# Patient Record
Sex: Female | Born: 1950 | Race: White | Hispanic: No | Marital: Single | Smoking: Never smoker
Health system: Southern US, Community
[De-identification: ages and names within clinical notes are randomized; demographics above are authoritative.]

## PROBLEM LIST (undated history)

## (undated) DIAGNOSIS — F329 Major depressive disorder, single episode, unspecified: Secondary | ICD-10-CM

## (undated) DIAGNOSIS — M1990 Unspecified osteoarthritis, unspecified site: Secondary | ICD-10-CM

## (undated) DIAGNOSIS — F32A Depression, unspecified: Secondary | ICD-10-CM

## (undated) DIAGNOSIS — R42 Dizziness and giddiness: Secondary | ICD-10-CM

## (undated) DIAGNOSIS — H905 Unspecified sensorineural hearing loss: Secondary | ICD-10-CM

## (undated) DIAGNOSIS — K219 Gastro-esophageal reflux disease without esophagitis: Secondary | ICD-10-CM

## (undated) DIAGNOSIS — K589 Irritable bowel syndrome without diarrhea: Secondary | ICD-10-CM

## (undated) DIAGNOSIS — G8929 Other chronic pain: Secondary | ICD-10-CM

## (undated) DIAGNOSIS — N189 Chronic kidney disease, unspecified: Secondary | ICD-10-CM

## (undated) DIAGNOSIS — Z972 Presence of dental prosthetic device (complete) (partial): Secondary | ICD-10-CM

## (undated) DIAGNOSIS — F419 Anxiety disorder, unspecified: Secondary | ICD-10-CM

## (undated) HISTORY — PX: CYSTOSCOPY: SUR368

---

## 2002-12-28 ENCOUNTER — Other Ambulatory Visit: Payer: Self-pay

## 2009-12-02 ENCOUNTER — Emergency Department: Payer: Self-pay | Admitting: Emergency Medicine

## 2015-08-20 NOTE — Discharge Instructions (Signed)
General Anesthesia, Adult, Care After °Refer to this sheet in the next few weeks. These instructions provide you with information on caring for yourself after your procedure. Your health care provider may also give you more specific instructions. Your treatment has been planned according to current medical practices, but problems sometimes occur. Call your health care provider if you have any problems or questions after your procedure. °WHAT TO EXPECT AFTER THE PROCEDURE °After the procedure, it is typical to experience: °· Sleepiness. °· Nausea and vomiting. °HOME CARE INSTRUCTIONS °· For the first 24 hours after general anesthesia: °· Have a responsible person with you. °· Do not drive a car. If you are alone, do not take public transportation. °· Do not drink alcohol. °· Do not take medicine that has not been prescribed by your health care provider. °· Do not sign important papers or make important decisions. °· You may resume a normal diet and activities as directed by your health care provider. °· Change bandages (dressings) as directed. °· If you have questions or problems that seem related to general anesthesia, call the hospital and ask for the anesthetist or anesthesiologist on call. °SEEK MEDICAL CARE IF: °· You have nausea and vomiting that continue the day after anesthesia. °· You develop a rash. °SEEK IMMEDIATE MEDICAL CARE IF:  °· You have difficulty breathing. °· You have chest pain. °· You have any allergic problems. °  °This information is not intended to replace advice given to you by your health care provider. Make sure you discuss any questions you have with your health care provider. °  °Document Released: 03/28/2000 Document Revised: 01/10/2014 Document Reviewed: 04/20/2011 °Elsevier Interactive Patient Education ©2016 [employer] ° °Cataract Surgery, Care After °Refer to this sheet in the next few weeks. These instructions provide you with information on caring for yourself after your  procedure. Your caregiver may also give you more specific instructions. Your treatment has been planned according to current medical practices, but problems sometimes occur. Call your caregiver if you have any problems or questions after your procedure.  °HOME CARE INSTRUCTIONS  °· Avoid strenuous activities as directed by your caregiver. °· Ask your caregiver when you can resume driving. °· Use eyedrops or other medicines to help healing and control pressure inside your eye as directed by your caregiver. °· Only take over-the-counter or prescription medicines for pain, discomfort, or fever as directed by your caregiver. °· Do not to touch or rub your eyes. °· You may be instructed to use a protective shield during the first few days and nights after surgery. If not, wear sunglasses to protect your eyes. This is to protect the eye from pressure or from being accidentally bumped. °· Keep the area around your eye clean and dry. Avoid swimming or allowing water to hit you directly in the face while showering. Keep soap and shampoo out of your eyes. °· Do not bend or lift heavy objects. Bending increases pressure in the eye. You can walk, climb stairs, and do light household chores. °· Do not put a contact lens into the eye that had surgery until your caregiver says it is okay to do so. °· Ask your doctor when you can return to work. This will depend on the kind of work that you do. If you work in a dusty environment, you may be advised to wear protective eyewear for a period of time. °· Ask your caregiver when it will be safe to engage in sexual activity. °· Continue with   your regular eye exams as directed by your caregiver. °What to expect: °· It is normal to feel itching and mild discomfort for a few days after cataract surgery. Some fluid discharge is also common, and your eye may be sensitive to light and touch. °· After 1 to 2 days, even moderate discomfort should disappear. In most cases, healing will take about  6 weeks. °· If you received an intraocular lens (IOL), you may notice that colors are very bright or have a blue tinge. Also, if you have been in bright sunlight, everything may appear reddish for a few hours. If you see these color tinges, it is because your lens is clear and no longer cloudy. Within a few months after receiving an IOL, these extra colors should go away. When you have healed, you will probably need new glasses. °SEEK MEDICAL CARE IF:  °· You have increased bruising around your eye. °· You have discomfort not helped by medicine. °SEEK IMMEDIATE MEDICAL CARE IF:  °· You have a  fever. °· You have a worsening or sudden vision loss. °· You have redness, swelling, or increasing pain in the eye. °· You have a thick discharge from the eye that had surgery. °MAKE SURE YOU: °· Understand these instructions. °· Will watch your condition. °· Will get help right away if you are not doing well or get worse. °  °This information is not intended to replace advice given to you by your health care provider. Make sure you discuss any questions you have with your health care provider. °  °Document Released: 07/09/2004 Document Revised: 01/10/2014 Document Reviewed: 08/13/2010 °Elsevier Interactive Patient Education ©2016 Elsevier Inc. ° °

## 2015-08-24 ENCOUNTER — Ambulatory Visit: Payer: BLUE CROSS/BLUE SHIELD | Admitting: Anesthesiology

## 2015-08-24 ENCOUNTER — Encounter: Payer: Self-pay | Admitting: Anesthesiology

## 2015-08-24 ENCOUNTER — Ambulatory Visit
Admission: RE | Admit: 2015-08-24 | Discharge: 2015-08-24 | Disposition: A | Discharge status: Home / Self Care | Payer: BLUE CROSS/BLUE SHIELD | Source: Ambulatory Visit | Attending: Ophthalmology | Admitting: Ophthalmology

## 2015-08-24 ENCOUNTER — Encounter
Admission: RE | Disposition: A | Discharge status: Home / Self Care | Payer: Self-pay | Source: Ambulatory Visit | Attending: Ophthalmology

## 2015-08-24 DIAGNOSIS — Z880 Allergy status to penicillin: Secondary | ICD-10-CM | POA: Insufficient documentation

## 2015-08-24 DIAGNOSIS — M1990 Unspecified osteoarthritis, unspecified site: Secondary | ICD-10-CM | POA: Insufficient documentation

## 2015-08-24 DIAGNOSIS — F329 Major depressive disorder, single episode, unspecified: Secondary | ICD-10-CM | POA: Insufficient documentation

## 2015-08-24 DIAGNOSIS — Z882 Allergy status to sulfonamides status: Secondary | ICD-10-CM | POA: Insufficient documentation

## 2015-08-24 DIAGNOSIS — K219 Gastro-esophageal reflux disease without esophagitis: Secondary | ICD-10-CM | POA: Insufficient documentation

## 2015-08-24 DIAGNOSIS — Z79899 Other long term (current) drug therapy: Secondary | ICD-10-CM | POA: Insufficient documentation

## 2015-08-24 DIAGNOSIS — Z87442 Personal history of urinary calculi: Secondary | ICD-10-CM | POA: Insufficient documentation

## 2015-08-24 DIAGNOSIS — H269 Unspecified cataract: Secondary | ICD-10-CM | POA: Diagnosis present

## 2015-08-24 DIAGNOSIS — K589 Irritable bowel syndrome without diarrhea: Secondary | ICD-10-CM | POA: Insufficient documentation

## 2015-08-24 DIAGNOSIS — H2511 Age-related nuclear cataract, right eye: Secondary | ICD-10-CM | POA: Insufficient documentation

## 2015-08-24 HISTORY — DX: Unspecified osteoarthritis, unspecified site: M19.90

## 2015-08-24 HISTORY — DX: Gastro-esophageal reflux disease without esophagitis: K21.9

## 2015-08-24 HISTORY — DX: Presence of dental prosthetic device (complete) (partial): Z97.2

## 2015-08-24 HISTORY — DX: Chronic kidney disease, unspecified: N18.9

## 2015-08-24 HISTORY — DX: Major depressive disorder, single episode, unspecified: F32.9

## 2015-08-24 HISTORY — PX: CATARACT EXTRACTION W/PHACO: SHX586

## 2015-08-24 HISTORY — DX: Depression, unspecified: F32.A

## 2015-08-24 HISTORY — DX: Irritable bowel syndrome, unspecified: K58.9

## 2015-08-24 SURGERY — PHACOEMULSIFICATION, CATARACT, WITH IOL INSERTION
Anesthesia: Monitor Anesthesia Care | Site: Eye | Laterality: Right | Wound class: Clean

## 2015-08-24 MED ORDER — LACTATED RINGERS IV SOLN: INTRAVENOUS | Status: DC

## 2015-08-24 MED ORDER — TRYPAN BLUE 0.06 % OP SOLN
OPHTHALMIC | Status: DC | PRN
  Administered 2015-08-24: 0.5 mL via INTRAOCULAR

## 2015-08-24 MED ORDER — FENTANYL CITRATE (PF) 100 MCG/2ML IJ SOLN
INTRAMUSCULAR | Status: DC | PRN
  Administered 2015-08-24: 50 ug via INTRAVENOUS

## 2015-08-24 MED ORDER — POVIDONE-IODINE 5 % OP SOLN
1.0000 "application " | OPHTHALMIC | Status: DC | PRN
  Administered 2015-08-24: 1 via OPHTHALMIC

## 2015-08-24 MED ORDER — ACETAMINOPHEN 325 MG PO TABS
650.0000 mg | ORAL_TABLET | Freq: Once | ORAL | Status: AC
  Administered 2015-08-24: 650 mg via ORAL

## 2015-08-24 MED ORDER — MIDAZOLAM HCL 2 MG/2ML IJ SOLN
INTRAMUSCULAR | Status: DC | PRN
  Administered 2015-08-24: 2 mg via INTRAVENOUS

## 2015-08-24 MED ORDER — OXYCODONE HCL 5 MG/5ML PO SOLN: 5.0000 mg | Freq: Once | ORAL | Status: DC | PRN

## 2015-08-24 MED ORDER — OXYCODONE HCL 5 MG PO TABS: 5.0000 mg | ORAL_TABLET | Freq: Once | ORAL | Status: DC | PRN

## 2015-08-24 MED ORDER — NA HYALUR & NA CHOND-NA HYALUR 0.4-0.35 ML IO KIT
PACK | INTRAOCULAR | Status: DC | PRN
  Administered 2015-08-24: 1 mL via INTRAOCULAR

## 2015-08-24 MED ORDER — LIDOCAINE HCL (PF) 4 % IJ SOLN
INTRAMUSCULAR | Status: DC | PRN
  Administered 2015-08-24: 1 mL via OPHTHALMIC

## 2015-08-24 MED ORDER — ARMC OPHTHALMIC DILATING GEL
1.0000 "application " | OPHTHALMIC | Status: DC | PRN
  Administered 2015-08-24 (×2): 1 via OPHTHALMIC

## 2015-08-24 MED ORDER — MOXIFLOXACIN HCL 0.5 % OP SOLN
OPHTHALMIC | Status: DC | PRN
  Administered 2015-08-24: .3 [drp] via OPHTHALMIC

## 2015-08-24 MED ORDER — EPINEPHRINE HCL 1 MG/ML IJ SOLN
INTRAOCULAR | Status: DC | PRN
  Administered 2015-08-24: 128 mL via OPHTHALMIC

## 2015-08-24 MED ORDER — TETRACAINE HCL 0.5 % OP SOLN
1.0000 [drp] | OPHTHALMIC | Status: DC | PRN
  Administered 2015-08-24: 1 [drp] via OPHTHALMIC

## 2015-08-24 MED ORDER — TIMOLOL MALEATE 0.5 % OP SOLN
OPHTHALMIC | Status: DC | PRN
  Administered 2015-08-24: 1 [drp] via OPHTHALMIC

## 2015-08-24 MED ORDER — BRIMONIDINE TARTRATE 0.2 % OP SOLN
OPHTHALMIC | Status: DC | PRN
Start: 2015-08-24 — End: 2015-08-24
  Administered 2015-08-24: 1 [drp] via OPHTHALMIC

## 2015-08-24 SURGICAL SUPPLY — 31 items
APL FBRTP 3 NS LF CTTN WD (MISCELLANEOUS) ×1
APPLICATOR COTTON TIP 3IN (MISCELLANEOUS) ×3 IMPLANT
CANNULA ANT/CHMB 27G (MISCELLANEOUS) ×1 IMPLANT
CANNULA ANT/CHMB 27GA (MISCELLANEOUS) ×3 IMPLANT
DISSECTOR HYDRO NUCLEUS 50X22 (MISCELLANEOUS) ×3 IMPLANT
GLOVE BIO SURGEON STRL SZ7 (GLOVE) ×5 IMPLANT
GLOVE SURG LX 6.5 MICRO (GLOVE) ×2
GLOVE SURG LX STRL 6.5 MICRO (GLOVE) ×1 IMPLANT
GOWN STRL REUS W/ TWL LRG LVL3 (GOWN DISPOSABLE) ×2 IMPLANT
GOWN STRL REUS W/TWL LRG LVL3 (GOWN DISPOSABLE) ×6
LENS IOL ACRYSOF IQ 22.0 (Intraocular Lens) ×2 IMPLANT
MARKER SKIN DUAL TIP RULER LAB (MISCELLANEOUS) ×3 IMPLANT
NDL FILTER BLUNT 18X1 1/2 (NEEDLE) ×1 IMPLANT
NEEDLE FILTER BLUNT 18X 1/2SAF (NEEDLE) ×2
NEEDLE FILTER BLUNT 18X1 1/2 (NEEDLE) ×1 IMPLANT
PACK CATARACT BRASINGTON (MISCELLANEOUS) ×3 IMPLANT
PACK EYE AFTER SURG (MISCELLANEOUS) ×3 IMPLANT
PACK OPTHALMIC (MISCELLANEOUS) ×3 IMPLANT
RING MALYGIN 7.0 (MISCELLANEOUS) IMPLANT
SOL BAL SALT 15ML (MISCELLANEOUS)
SOLUTION BAL SALT 15ML (MISCELLANEOUS) IMPLANT
SUT ETHILON 10-0 CS-B-6CS-B-6 (SUTURE)
SUT VICRYL  9 0 (SUTURE)
SUT VICRYL 9 0 (SUTURE) IMPLANT
SUTURE EHLN 10-0 CS-B-6CS-B-6 (SUTURE) IMPLANT
SYR 3ML LL SCALE MARK (SYRINGE) ×3 IMPLANT
SYR TB 1ML LUER SLIP (SYRINGE) ×3 IMPLANT
WATER STERILE IRR 250ML POUR (IV SOLUTION) ×3 IMPLANT
WATER STERILE IRR 500ML POUR (IV SOLUTION) IMPLANT
WICK EYE OCUCEL (MISCELLANEOUS) IMPLANT
WIPE NON LINTING 3.25X3.25 (MISCELLANEOUS) ×3 IMPLANT

## 2015-08-24 NOTE — Anesthesia Procedure Notes (Signed)
Procedure Name: MAC Performed by: Zaiyden Strozier Pre-anesthesia Checklist: Patient identified, Emergency Drugs available, Suction available, Timeout performed and Patient being monitored Patient Re-evaluated:Patient Re-evaluated prior to inductionOxygen Delivery Method: Nasal cannula Placement Confirmation: positive ETCO2     

## 2015-08-24 NOTE — Transfer of Care (Signed)
Immediate Anesthesia Transfer of Care Note  Patient: Chelsea Skinner  Procedure(s) Performed: Procedure(s) with comments: CATARACT EXTRACTION PHACO AND INTRAOCULAR LENS PLACEMENT (IOC) (Right) - VISION BLUE RIGHT  Patient Location: PACU  Anesthesia Type: MAC  Level of Consciousness: awake, alert  and patient cooperative  Airway and Oxygen Therapy: Patient Spontanous Breathing and Patient connected to supplemental oxygen  Post-op Assessment: Post-op Vital signs reviewed, Patient's Cardiovascular Status Stable, Respiratory Function Stable, Patent Airway and No signs of Nausea or vomiting  Post-op Vital Signs: Reviewed and stable  Complications: No apparent anesthesia complications

## 2015-08-24 NOTE — Anesthesia Postprocedure Evaluation (Signed)
Anesthesia Post Note  Patient: Chelsea Skinner  Procedure(s) Performed: Procedure(s) (LRB): CATARACT EXTRACTION PHACO AND INTRAOCULAR LENS PLACEMENT (IOC) (Right)  Patient location during evaluation: PACU Anesthesia Type: MAC Level of consciousness: awake and alert Pain management: pain level controlled Vital Signs Assessment: post-procedure vital signs reviewed and stable Respiratory status: spontaneous breathing, nonlabored ventilation, respiratory function stable and patient connected to nasal cannula oxygen Cardiovascular status: stable and blood pressure returned to baseline Anesthetic complications: no    Semiyah Newgent

## 2015-08-24 NOTE — H&P (Signed)
H+P reviewed and is up to date, please see paper chart.  

## 2015-08-24 NOTE — Anesthesia Preprocedure Evaluation (Signed)
Anesthesia Evaluation  Patient identified by MRN, date of birth, ID band  Reviewed: NPO status   History of Anesthesia Complications Negative for: history of anesthetic complications  Airway Mallampati: II  TM Distance: >3 FB Neck ROM: full    Dental  (+) Upper Dentures, Lower Dentures   Pulmonary neg pulmonary ROS,    Pulmonary exam normal        Cardiovascular Exercise Tolerance: Good negative cardio ROS Normal cardiovascular exam     Neuro/Psych Depression negative neurological ROS     GI/Hepatic Neg liver ROS, GERD  ,ibs    Endo/Other  negative endocrine ROS  Renal/GU negative Renal ROS  negative genitourinary   Musculoskeletal  (+) Arthritis ,   Abdominal   Peds  Hematology negative hematology ROS (+)   Anesthesia Other Findings   Reproductive/Obstetrics                             Anesthesia Physical Anesthesia Plan  ASA: II  Anesthesia Plan: MAC   Post-op Pain Management:    Induction:   Airway Management Planned:   Additional Equipment:   Intra-op Plan:   Post-operative Plan:   Informed Consent: I have reviewed the patients History and Physical, chart, labs and discussed the procedure including the risks, benefits and alternatives for the proposed anesthesia with the patient or authorized representative who has indicated his/her understanding and acceptance.     Plan Discussed with: CRNA  Anesthesia Plan Comments:         Anesthesia Quick Evaluation

## 2015-08-24 NOTE — Op Note (Addendum)
Date of Surgery: 08/24/2015  PREOPERATIVE DIAGNOSES: Visually significant dense nuclear sclerotic cataract, right eye.  POSTOPERATIVE DIAGNOSES: Same  PROCEDURES PERFORMED: Cataract extraction with intraocular lens implant, right eye with aid of vision blue dye.  SURGEON: Devin GoingAnita P. Vin, M.D.  ANESTHESIA: MAC and topical  IMPLANTS: [id number] +22.0 D  Implant Name Type Inv. Item Serial No. Manufacturer Lot No. LRB No. Used  LENS IOL ACRYSOF IQ 22.0 - [id number] Intraocular Lens LENS IOL ACRYSOF IQ 22.0 [id number] ALCON   Right 1     COMPLICATIONS: None.  DESCRIPTION OF PROCEDURE: Therapeutic options were discussed with the patient preoperatively, including a discussion of risks and benefits of surgery. Informed consent was obtained. An IOL-Master and immersion biometry were used to take the lens measurements, and a dilated fundus exam was performed within 6 months of the surgical date.  The patient was premedicated and brought to the operating room and placed on the operating table in the supine position. After adequate anesthesia, the patient was prepped and draped in the usual sterile ophthalmic fashion. A wire lid speculum was inserted and the microscope was positioned. A Superblade was used to create a paracentesis site at the limbus and a small amount of dilute preservative free lidocaine was instilled into the anterior chamber, followed by vision blue, then dispersive viscoelastic. A clear corneal incision was created temporally using a 2.4 mm keratome blade. Capsulorrhexis was then performed. In situ phacoemulsification was performed.  Cortical material was removed with the irrigation-aspiration unit. Dispersive viscoelastic was instilled to open the capsular bag. A posterior chamber intraocular lens with the specifications above was inserted and positioned. Irrigation-aspiration was used to remove all viscoelastic. Vigamox 1cc was instilled into the anterior chamber, and the corneal  incision was checked and found to be water tight. The eyelid speculum was removed.  The operative eye was covered with protective goggles after instilling 1 drop of timolol and brimonidine. The patient tolerated the procedure well. There were no complications.

## 2015-08-25 ENCOUNTER — Encounter: Payer: Self-pay | Admitting: Ophthalmology

## 2016-02-05 ENCOUNTER — Emergency Department: Payer: Worker's Compensation

## 2016-02-05 ENCOUNTER — Emergency Department
Admission: EM | Admit: 2016-02-05 | Discharge: 2016-02-05 | Disposition: A | Discharge status: Home / Self Care | Payer: Worker's Compensation | Attending: Emergency Medicine | Admitting: Emergency Medicine

## 2016-02-05 ENCOUNTER — Encounter: Payer: Self-pay | Admitting: Emergency Medicine

## 2016-02-05 DIAGNOSIS — S0990XA Unspecified injury of head, initial encounter: Secondary | ICD-10-CM | POA: Insufficient documentation

## 2016-02-05 DIAGNOSIS — W109XXA Fall (on) (from) unspecified stairs and steps, initial encounter: Secondary | ICD-10-CM | POA: Insufficient documentation

## 2016-02-05 DIAGNOSIS — S40012A Contusion of left shoulder, initial encounter: Secondary | ICD-10-CM | POA: Diagnosis not present

## 2016-02-05 DIAGNOSIS — Z23 Encounter for immunization: Secondary | ICD-10-CM | POA: Insufficient documentation

## 2016-02-05 DIAGNOSIS — Y929 Unspecified place or not applicable: Secondary | ICD-10-CM | POA: Insufficient documentation

## 2016-02-05 DIAGNOSIS — S0003XA Contusion of scalp, initial encounter: Secondary | ICD-10-CM

## 2016-02-05 DIAGNOSIS — S29012A Strain of muscle and tendon of back wall of thorax, initial encounter: Secondary | ICD-10-CM | POA: Insufficient documentation

## 2016-02-05 DIAGNOSIS — S8981XA Other specified injuries of right lower leg, initial encounter: Secondary | ICD-10-CM

## 2016-02-05 DIAGNOSIS — Y9389 Activity, other specified: Secondary | ICD-10-CM | POA: Insufficient documentation

## 2016-02-05 DIAGNOSIS — Y990 Civilian activity done for income or pay: Secondary | ICD-10-CM | POA: Diagnosis not present

## 2016-02-05 DIAGNOSIS — S29019A Strain of muscle and tendon of unspecified wall of thorax, initial encounter: Secondary | ICD-10-CM

## 2016-02-05 DIAGNOSIS — N189 Chronic kidney disease, unspecified: Secondary | ICD-10-CM | POA: Insufficient documentation

## 2016-02-05 DIAGNOSIS — S8011XA Contusion of right lower leg, initial encounter: Secondary | ICD-10-CM | POA: Diagnosis not present

## 2016-02-05 DIAGNOSIS — S161XXA Strain of muscle, fascia and tendon at neck level, initial encounter: Secondary | ICD-10-CM | POA: Diagnosis not present

## 2016-02-05 DIAGNOSIS — Z79899 Other long term (current) drug therapy: Secondary | ICD-10-CM | POA: Insufficient documentation

## 2016-02-05 DIAGNOSIS — S80811A Abrasion, right lower leg, initial encounter: Secondary | ICD-10-CM

## 2016-02-05 DIAGNOSIS — S199XXA Unspecified injury of neck, initial encounter: Secondary | ICD-10-CM | POA: Diagnosis present

## 2016-02-05 LAB — URINALYSIS, COMPLETE (UACMP) WITH MICROSCOPIC
BILIRUBIN URINE: NEGATIVE
Bacteria, UA: NONE SEEN
GLUCOSE, UA: NEGATIVE mg/dL
Hgb urine dipstick: NEGATIVE
KETONES UR: NEGATIVE mg/dL
LEUKOCYTES UA: NEGATIVE
NITRITE: NEGATIVE
PH: 5 (ref 5.0–8.0)
Protein, ur: NEGATIVE mg/dL
SPECIFIC GRAVITY, URINE: 1.016 (ref 1.005–1.030)

## 2016-02-05 MED ORDER — TETANUS-DIPHTH-ACELL PERTUSSIS 5-2.5-18.5 LF-MCG/0.5 IM SUSP
0.5000 mL | Freq: Once | INTRAMUSCULAR | Status: AC
  Administered 2016-02-05: 0.5 mL via INTRAMUSCULAR
  Filled 2016-02-05: qty 0.5

## 2016-02-05 MED ORDER — HYDROCODONE-ACETAMINOPHEN 5-325 MG PO TABS: 1.0000 | ORAL_TABLET | ORAL | 0 refills | Status: AC | PRN

## 2016-02-05 NOTE — ED Triage Notes (Signed)
Pt states she tripped and fell this AM at work. Pt states she has laceration to right lower leg that has dressing present during triage. Pt also complains of pain to left leg. Pt states she did hit her head on a pole but denies LOC, headaches.

## 2016-02-05 NOTE — ED Provider Notes (Signed)
[facility] Emergency Department Provider Note  ____________________________________________   First MD Initiated Contact with Patient 02/05/16 1007     (approximate)  I have reviewed the triage vital signs and the nursing notes.   HISTORY  Chief Complaint Fall    HPI Chelsea Skinner is a 66 y.o. female is here with complaint of multiple body aches. Patient fell this morning at work while going down steps. She believes that she hit her shoe against some uneven concrete at the top of the stairs causing her to tumble 12 steps. Coworkers state that she struck her head twice before stopping. Patient denies any loss of consciousness. She denies any nausea or vomiting. She does complain of her left shoulder hurting and has she rates her pain an abrasion to her right lower leg. Bleeding is under control. Patient is unsure of when the last time she had a tetanus booster. Patient has been able to ambulate with minimal assistance. She also has had some upper back and neck pain since falling.She rates her pain is 7 out of 10 at present. Nothing has improved or made her pain worse.   Past Medical History:  Diagnosis Date  . Arthritis    BOTH KNEES  . Chronic kidney disease    HX/O  . Depression   . GERD (gastroesophageal reflux disease)   . IBS (irritable bowel syndrome)   . Wears partial dentures    UPPER AND LOWER    There are no active problems to display for this patient.   Past Surgical History:  Procedure Laterality Date  . CATARACT EXTRACTION W/PHACO Right 08/24/2015   Procedure: CATARACT EXTRACTION PHACO AND INTRAOCULAR LENS PLACEMENT (IOC);  Surgeon: Sherald Hess, MD;  [facility];  Service: Ophthalmology;  Laterality: Right;  VISION BLUE RIGHT  . CYSTOSCOPY      Prior to Admission medications   Medication Sig Start Date End Date Taking? Authorizing Provider  dicyclomine (BENTYL) 10 MG capsule Take 10 mg by mouth at  bedtime.     Historical Provider, MD  HYDROcodone-acetaminophen (NORCO/VICODIN) 5-325 MG tablet Take 1 tablet by mouth every 4 (four) hours as needed for moderate pain. 02/05/16   Tommi Rumps, PA-C  Multiple Vitamin (MULTIVITAMIN) tablet Take 1 tablet by mouth daily. AM    Historical Provider, MD  Omega-3 Fatty Acids (FISH OIL) 1000 MG CAPS Take 1 capsule by mouth. AM    Historical Provider, MD  venlafaxine XR (EFFEXOR-XR) 150 MG 24 hr capsule Take 150 mg by mouth at bedtime.    Historical Provider, MD    Allergies Penicillins and Sulfa antibiotics  No family history on file.  Social History Social History  Substance Use Topics  . Smoking status: Never Smoker  . Smokeless tobacco: Never Used  . Alcohol use No    Review of Systems Constitutional: No fever/chills Eyes: No visual changes. ENT: No trauma Cardiovascular: Denies chest pain. Respiratory: Denies shortness of breath. Gastrointestinal: No abdominal pain.  No nausea, no vomiting.   Genitourinary: Negative for dysuria. Musculoskeletal: Positive cervical pain, positive left shoulder pain, positive right lower leg pain. Skin: Positive for abrasions and ecchymosis. Neurological: Negative for headaches, focal weakness or numbness.  10-point ROS otherwise negative.  ____________________________________________   PHYSICAL EXAM:  VITAL SIGNS: ED Triage Vitals [02/05/16 0910]  Enc Vitals Group     BP 140/69     Pulse Rate 85     Resp 18     Temp 98.3 F (36.8  C)     Temp Source Oral     SpO2 99 %     Weight 165 lb (74.8 kg)     Height 5\' 8"  (1.727 m)     Head Circumference      Peak Flow      Pain Score 7     Pain Loc      Pain Edu?      Excl. in GC?     Constitutional: Alert and oriented. Well appearing and in no acute distress. Eyes: Conjunctivae are normal. PERRL. EOMI. Head: Atraumatic. Nose: No trauma Mouth:  No trauma exteriorly. Neck: No stridor.  Minimal cervical spine tenderness. No gross  deformity. No ecchymosis or abrasions were noted. Range of motion is without restriction. Cardiovascular: Normal rate, regular rhythm. Grossly normal heart sounds.  Good peripheral circulation. Respiratory: Normal respiratory effort.  No retractions. Lungs CTAB. Gastrointestinal: Soft and nontender. No distention.  Musculoskeletal: On examination of the left shoulder there is an ecchymotic area on the anterior aspect of the proximal humeral area. Range of motion is slightly restricted secondary to discomfort. No gross deformity is noted. There is tenderness on palpation of the upper thoracic spine without gross deformity noted. Range of motion is minimally restricted. There is no tenderness on palpation of the lumbar spine or pelvic region. There is tenderness on palpation of the anterior right tibia with a soft tissue abrasion with no active bleeding at this time. No gross deformity was noted. Patient is able to move right knee and ankle with minimal discomfort. Left lower extremity without any obvious trauma. Bilateral ankles are nontender. Neurologic:  Normal speech and language. No gross focal neurologic deficits are appreciated. Gait was not tested due to patient injuries at this time. Posterior scalp with tender areas but no abrasions or bleeding was noted. Skin:  Skin is warm, dry and intact. Ecchymosis left anterior shoulder, abrasion anterior right tib-fib midline Psychiatric: Mood and affect are normal. Speech and behavior are normal.  ____________________________________________   LABS (all labs ordered are listed, but only abnormal results are displayed)  Labs Reviewed  URINALYSIS, COMPLETE (UACMP) WITH MICROSCOPIC - Abnormal; Notable for the following:       Result Value   Color, Urine YELLOW (*)    APPearance CLEAR (*)    Squamous Epithelial / LPF 0-5 (*)    All other components within normal limits    RADIOLOGY Right shoulder x-ray per radiologist is negative for fracture  dislocation. Right tib-fib per radiologist is negative for acute bony abnormality. Thoracic spine per radiologist is negative for acute findings. I, Tommi Rumps, personally viewed and evaluated these images (plain radiographs) as part of my medical decision making, as well as reviewing the written report by the radiologist.  CT head and cervical spine without contrast radiologist: IMPRESSION: 1. Normal for age non contrast CT appearance of the brain. 2. No acute fracture or listhesis identified in the cervical spine. 3. 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 ____________________________________________   PROCEDURES  Procedure(s) performed: None  Procedures  Critical Care performed: No  ____________________________________________   INITIAL IMPRESSION / ASSESSMENT AND PLAN / ED COURSE  Pertinent labs & imaging results that were available during my care of the patient were reviewed by me and considered in my medical decision making (see chart for details).    Clinical Course as of Feb 05 1611  Fri Feb 05, 2016  1138 DG Shoulder Left [RS]    Clinical Course  User Index [RS] Tommi Rumpshonda L Maryl Blalock, PA-C   Patient was made aware that her x-rays did not show any acute injury. She was also reassured that her head and neck did not show any acute injury or injury to the brain. Patient is aware that she does have degenerative changes and is also aware that she will be more sore tomorrow than she is currently. Patient was instructed to use mild soap and water to her right leg and watch for signs of infection around her abrasion. She is encouraged to use ice and elevation to reduce swelling and pain. Patient was given a prescription for Norco and ibuprofen as needed for pain and inflammation. She is [facility] if any continued problems. Patient is aware that she is not to take any medication that could cause  drowsiness and increase her risk for injury. Patient was also given a tetanus booster prior to her discharge.  ____________________________________________   FINAL CLINICAL IMPRESSION(S) / ED DIAGNOSES  Final diagnoses:  Cervical strain, acute, initial encounter  Strain of thoracic region, initial encounter  Contusion of left shoulder, initial encounter  Contusion of right tibia  Abrasion of anterior right lower leg, initial encounter  Contusion of scalp, initial encounter      NEW MEDICATIONS STARTED DURING THIS VISIT:  Discharge Medication List as of 02/05/2016 11:57 AM    START taking these medications   Details  HYDROcodone-acetaminophen (NORCO/VICODIN) 5-325 MG tablet Take 1 tablet by mouth every 4 (four) hours as needed for moderate pain., Starting Fri 02/05/2016, Print         Note:  This document was prepared using Dragon voice recognition software and may include unintentional dictation errors.    Tommi RumpsRhonda L Jinx Gilden, PA-C 02/05/16 1613    Jennye MoccasinBrian S Quigley, MD 02/06/16 [phone]

## 2016-02-05 NOTE — ED Notes (Signed)
Pt still in radiology.

## 2016-02-05 NOTE — Discharge Instructions (Signed)
Begin taking Norco and ibuprofen as needed for pain. Ice to your shoulder and leg as needed for pain and swelling. You may also elevate your right leg if needed to help control swelling and pain. Follow-up with her primary care doctor or [facility] if any continued problems. Watch abrasion for any signs of infection. Clean daily with mild soap and water. Do not take pain medication while driving or operating machinery as this may cause increased drowsiness and increase your risk for injury.

## 2016-11-29 ENCOUNTER — Other Ambulatory Visit: Payer: Self-pay

## 2016-11-29 ENCOUNTER — Ambulatory Visit: Payer: Worker's Compensation | Attending: Neurosurgery | Admitting: *Deleted

## 2016-11-29 DIAGNOSIS — R41841 Cognitive communication deficit: Secondary | ICD-10-CM | POA: Diagnosis present

## 2016-11-29 NOTE — Therapy (Signed)
[facility] [address] [place], [place], [address] Phone: [phone]   Fax:  [phone]  Speech Language Pathology Evaluation  Patient Details  Name: Chelsea Skinner MRN: [id number] Date of Birth: 1950-12-03 Referring Provider: Dr. Amalia Hailey   Encounter Date: 11/29/2016  End of Session - 11/29/16 1504    Visit Number  1    Number of Visits  13    Date for SLP Re-Evaluation  01/17/17    Authorization Type  evaluation plus 12 visits    Authorization Time Period  expires 01/17/17    Authorization - Visit Number  1    Authorization - Number of Visits  13    SLP Start Time  1400    SLP Stop Time   1450    SLP Time Calculation (min)  50 min    Activity Tolerance  Patient tolerated treatment well       Past Medical History:  Diagnosis Date  . Arthritis    BOTH KNEES  . Chronic kidney disease    HX/O  . Depression   . GERD (gastroesophageal reflux disease)   . IBS (irritable bowel syndrome)   . Wears partial dentures    UPPER AND LOWER    Past Surgical History:  Procedure Laterality Date  . CATARACT EXTRACTION W/PHACO Right 08/24/2015   Procedure: CATARACT EXTRACTION PHACO AND INTRAOCULAR LENS PLACEMENT (IOC);  Surgeon: Sherald Hess, MD;  Location: [facility];  Service: Ophthalmology;  Laterality: Right;  VISION BLUE RIGHT  . CYSTOSCOPY      There were no vitals filed for this visit.  Subjective Assessment - 11/29/16 1414    Subjective  They said it would take some time to get my head together again. I have trouble remembering sometimes, everythihng is confusing to me    Currently in Pain?  Yes    Pain Score  6     Pain Location  Head    Pain Orientation  Right;Mid    Pain Descriptors / Indicators  Aching    Pain Type  Acute pain    Pain Onset  More than a month ago    Pain Frequency  Constant    Aggravating Factors   stress    Pain Relieving Factors  tylenol     Effect of  Pain on Daily Activities  occasionally have to go to bed         SLP Evaluation [facility] - 11/29/16 1414      SLP Visit Information   SLP Received On  11/29/16    Referring Provider  Dr. Delorise Royals Bhowmick    Onset Date  11/02/16    Medical Diagnosis  fall down stairs      Subjective   Subjective  Pt seen in outpatient clinic for speech therapy evaluation following TBI/fall    Patient/Family Stated Goal  to return to remorbid level of independence and activity      General Information   HPI  66 year old female referred for outpatient speech therapy evaluation following a fall down a flight of stairs on 11/02/16.   Pt sustained a left temporal intraparenchymal hemorrhage, left SDH with 3mm shift, SAH along left vertex and right parietal lobe, minimally displaced fracture of the right lateral orbital wall, fracture of right greater wing of the sphenoid bone, and nondisplaced fractures of the right zygomatic arch and right nasal bone.   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      Cognition   Overall Cognitive Status  Impaired/Different from baseline    Area of Impairment  Memory;Problem solving    Memory  Decreased short-term memory    Memory Comments  Pt reports difficulty recalling medications    Problem Solving  Difficulty sequencing    Problem Solving Comments  -- thought organization and reasoning deficits    Attention  Alternating;Divided    Alternating Attention   Impaired    Alternating Attention Impairment  Verbal basic;Functional basic    Divided Attention  Impaired    Divided Attention Impairment  Verbal basic;Functional basic    Memory  Impaired    Memory Impairment  Decreased recall of new information;Decreased short term memory;Prospective memory    Decreased Short Term Memory  Verbal complex;Functional complex    Awareness  Appears intact    Problem Solving  Impaired    Problem Solving Impairment  Verbal basic;Verbal complex;Functional basic;Functional complex    Executive Function  Reasoning;Organizing;Self Monitoring;Self Correcting difficulty on MoCA subtest    Reasoning  Impaired    Reasoning Impairment  Verbal complex;Functional complex    Organizing  Impaired    Organizing Impairment  Verbal basic;Functional basic    Self Monitoring  Impaired    Self Monitoring Impairment  Verbal basic;Functional basic    Self Correcting  Impaired    Self Correcting Impairment  Verbal basic;Functional basic    Behaviors  Poor frustration tolerance, occasionally tearful     Auditory Comprehension   Overall Auditory Comprehension  Appears within functional limits for tasks assessed      Visual Recognition/Discrimination   Discrimination  Within Function Limits      Reading Comprehension   Reading Status  Within funtional limits      Expression   Primary Mode of Expression  Verbal      Verbal Expression   Overall Verbal Expression  Appears within functional limits for tasks assessed      Written Expression   Dominant Hand  Right    Written Expression  Within Functional Limits      Oral Motor/Sensory Function   Overall Oral Motor/Sensory Function  Appears within functional limits for tasks assessed      Motor Speech   Overall Motor Speech  Appears within functional limits for tasks assessed      Standardized Assessments   Standardized Assessments   Montreal Cognitive Assessment (MOCA)    Montreal Cognitive Assessment (MOCA)   27/30                       SLP Education - 11/29/16 1503    Education provided  Yes    Education Details  MoCA results, recommendation for outpatient ST for higher level cognitive linguistic deficits and executive functions    Person(s) Educated  Patient    Methods  Explanation;Verbal cues    Comprehension  Verbalized understanding;Need further instruction  SLP Short Term Goals - 11/29/16 1511      SLP SHORT TERM GOAL #1   Title  Pt will complete simple thought organization/problem solving/reasoning tasks with 80% accuracy given mod cues    Time  4    Period  Weeks    Status  New      SLP SHORT TERM GOAL #2   Title  Pt will identify and develop compensatory strategy for functional recall with mod assist    Time  4    Period  Weeks    Status  New      SLP SHORT TERM GOAL #3   Title  Pt will fill med organizer accurately, given min cues    Time  4    Period  Weeks    Status  New       SLP Long Term Goals - 11/29/16 1513      SLP LONG TERM GOAL #1   Title  Pt will complete level 3/abstract thought organization, problem solving/reasoning tasks with 90% accuracy given min cues    Time  8    Period  Weeks    Status  New      SLP LONG TERM GOAL #2   Title  Pt will demonstrate use of compensatory strategy for functional recall at least 2x outside of therapy session    Time  8    Period  Weeks    Status  New      SLP LONG TERM GOAL #3   Title  Pt will accurately fill med organizer independently.    Time  8    Period  Weeks    Status  New       Plan - 11/29/16 1505    Clinical Impression Statement  The Montreal Cognitive Assessment (MoCA) was administered. Pt scored 27/30 (n=26+/30) indicating performance within functional limits for pt age and level of education. Points were lost on executive function, thought organization, and abstract reasoning subtests. Additionally, the multiprocess reasoning tasks from the Scales of Cognitive of Traumatic Brain Injury  (SCATBI) were administered. Pt was able to accurately complete 1/3 activities, indicating difficulty with higher level cognitive processing, reasoning, problem solving and attention.   These deficits identified today correlate to pt report of difficulty recalling medications, and her difficulty returning to her previous level of function in the home, where she was managing the household finances as well as cooking and cleaning. 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   Skilled ST intervention is recommended to maximize pt cognitive linguistic function for return to prior level of function and improved quality of life.     Speech Therapy Frequency  2x / week    Duration  -- 8 weeks or 12 more visits    Treatment/Interventions  Internal/external aids;Functional tasks;SLP instruction and feedback;Patient/family education;Compensatory techniques;Cognitive reorganization    Potential to Achieve Goals  Good    Potential Considerations  Ability to learn/carryover information;Cooperation/participation level;Previous level of function;Family/community support;Severity of impairments    SLP Home Exercise Plan  reviewed with pt    Consulted and Agree with Plan of Care  Patient       Patient will benefit from skilled therapeutic intervention in order to improve the following deficits and impairments:   Cognitive communication deficit  G-Codes - 11/29/16 1516    Functional Assessment Tool Used  asha noms, clinical judgment. MoCA    Functional Limitations  Attention  Attention Current Status [phone](G9165)  At least 1 percent but less than 20 percent impaired, limited or restricted    Attention Goal Status ([id number](G9166)  At least 1 percent but less than 20 percent impaired, limited or restricted       Problem List There are no active problems to display for this patient.  Ivone Licht B. Murvin NatalBueche, [facility] HospitalMSP, CCC-SLP Speech Language Pathologist  Leigh AuroraBueche, Daivion Pape  Brown 11/29/2016, 3:18 PM  [facility] [address] [place], [place], [address] Phone: [phone]   Fax:  [phone]  Name: [name] Alldredge MRN: [id number] Date of Birth: 08/02/1950

## 2016-12-09 ENCOUNTER — Ambulatory Visit: Payer: Worker's Compensation | Attending: Neurosurgery

## 2016-12-09 ENCOUNTER — Other Ambulatory Visit: Payer: Self-pay

## 2016-12-09 DIAGNOSIS — R41841 Cognitive communication deficit: Secondary | ICD-10-CM | POA: Insufficient documentation

## 2016-12-09 NOTE — Patient Instructions (Signed)
  Please complete the assigned speech therapy homework prior to your next session and return it to the speech therapist at your next visit. If you need help with homework, please ask someone to help you.

## 2016-12-09 NOTE — Therapy (Signed)
[facility] [address] [place], [place], [address] Phone: [phone]   Fax:  [phone]  Speech Language Pathology Treatment  Patient Details  Name: Chelsea Skinner MRN: [id number] Date of Birth: 1950/02/08 Referring Provider: Dr. Amalia Haileyeb A Bhowmick   Encounter Date: 12/09/2016  End of Session - 12/09/16 1545    Visit Number  2    Number of Visits  13    Date for SLP Re-Evaluation  01/17/17    Authorization Type  evaluation plus 12 visits    Authorization Time Period  expires 01/17/17    Authorization - Visit Number  2    Authorization - Number of Visits  13    SLP Start Time  1452    SLP Stop Time   1532    SLP Time Calculation (min)  40 min       Past Medical History:  Diagnosis Date  . Arthritis    BOTH KNEES  . Chronic kidney disease    HX/O  . Depression   . GERD (gastroesophageal reflux disease)   . IBS (irritable bowel syndrome)   . Wears partial dentures    UPPER AND LOWER    Past Surgical History:  Procedure Laterality Date  . CATARACT EXTRACTION W/PHACO Right 08/24/2015   Procedure: CATARACT EXTRACTION PHACO AND INTRAOCULAR LENS PLACEMENT (IOC);  Surgeon: Sherald HessAnita Prakash Vin-Parikh, MD;  [facility];  Service: Ophthalmology;  Laterality: Right;  VISION BLUE RIGHT  . CYSTOSCOPY      There were no vitals filed for this visit.  Subjective Assessment - 12/09/16 1455    Subjective  Pt reports filling her med box. SLP suggested pt have someone to check her work for at least 3 refill cycles.    Pain Score  0-No pain            ADULT SLP TREATMENT - 12/09/16 1457      General Information   Behavior/Cognition  Alert;Cooperative;Pleasant mood      Cognitive-Linquistic Treatment   Treatment focused on  Cognition    Skilled Treatment  Pt reported filling her own med box but no one going behind her right now for verifcation of corect filling of box. With a simple deductive  reasoning puzzle, pt demo'd impulsiveness which exacerbated her decr'd error awareness and decr'd reasoning skills. SLP abandoned task and moved to next task- In the second almost identical task pt req'd max A usually. SLP returned to initial task reviewing with pt what she had learned in the task just prior and pt still req'd max A usually. Pt with error awareness with these tasks <10% of the time. Simple scrambled sentence task: pt req'd SBA for word total up to 7 words and no word repetitions. With word repetitions (e.g., "vetoed the the House the...") and/or sentences >7 words pt req'd occasional mod A. SLP asked pt if she should have someone go behind her to double check her medication box and she agreed that would be best. Homework provided.      Assessment / Recommendations / Plan   Plan  Continue with current plan of care      Progression Toward Goals   Progression toward goals  Progressing toward goals       SLP Education - 12/09/16 1544    Education provided  Yes    Education Details  defict areas, need someone to double check med box filling for at least three intervals.    Person(s) Educated  Patient  Methods  Explanation    Comprehension  Verbalized understanding;Need further instruction       SLP Short Term Goals - 12/09/16 1549      SLP SHORT TERM GOAL #1   Title  Pt will complete simple thought organization/problem solving/reasoning tasks with 80% accuracy given mod cues    Time  4    Period  Weeks    Status  On-going      SLP SHORT TERM GOAL #2   Title  Pt will identify and develop compensatory strategy for functional recall with mod assist    Time  4    Period  Weeks    Status  On-going      SLP SHORT TERM GOAL #3   Title  Pt will fill med organizer accurately, given min cues    Time  4    Period  Weeks    Status  On-going       SLP Long Term Goals - 12/09/16 1549      SLP LONG TERM GOAL #1   Title  Pt will complete level 3/abstract thought  organization, problem solving/reasoning tasks with 90% accuracy given min cues    Time  8    Period  Weeks    Status  On-going      SLP LONG TERM GOAL #2   Title  Pt will demonstrate use of compensatory strategy for functional recall at least 2x outside of therapy session    Time  8    Period  Weeks    Status  On-going      SLP LONG TERM GOAL #3   Title  Pt will accurately fill med organizer independently.    Time  8    Period  Weeks    Status  On-going       Plan - 12/09/16 1546    Clinical Impression Statement  Pt presented today with mod cognitive linguistic deficts in intellectual and emergent awareness, reasoning/problem solving, organization, and executive function. Pt reported filling her own med box and no one going behind her to double check. SLP strongly suggested pt have family member to double check med box and pt agreed with this at end of session. Pt would cont to beneift from skilled ST to maximize cognitive linguistic function for as close to return to PLOF as possible.     Speech Therapy Frequency  2x / week    Duration  -- 8 weeks or 12 more visits    Treatment/Interventions  Internal/external aids;Functional tasks;SLP instruction and feedback;Patient/family education;Compensatory techniques;Cognitive reorganization    Potential to Achieve Goals  Good    SLP Home Exercise Plan  reviewed with pt    Consulted and Agree with Plan of Care  Patient       Patient will benefit from skilled therapeutic intervention in order to improve the following deficits and impairments:   Cognitive communication deficit    Problem List There are no active problems to display for this patient.   [facility] PhfCHINKE,Chalmer Zheng ,MS, CCC-SLP  12/09/2016, 3:50 PM  [facility] [address] [place], [place], [address] Phone: [phone]   Fax:  [phone]   Name: Chelsea Skinner MRN: [id number] Date of Birth: 1950/06/19

## 2016-12-16 ENCOUNTER — Other Ambulatory Visit: Payer: Self-pay

## 2016-12-16 ENCOUNTER — Ambulatory Visit: Payer: Worker's Compensation | Attending: Neurosurgery

## 2016-12-16 DIAGNOSIS — R41841 Cognitive communication deficit: Secondary | ICD-10-CM | POA: Diagnosis not present

## 2016-12-16 NOTE — Therapy (Signed)
[facility] [address] [place], [place], [address] Phone: [phone]   Fax:  [phone]  Speech Language Pathology Treatment  Patient Details  Name: Chelsea KubaWanda Skinner MRN: [id number] Date of Birth: 23-Jan-1950 Referring Provider: Dr. Amalia Haileyeb A Bhowmick   Encounter Date: 12/16/2016  End of Session - 12/16/16 1416    Visit Number  3    Number of Visits  13    Date for SLP Re-Evaluation  01/17/17    Authorization Type  evaluation plus 12 visits    Authorization Time Period  expires 01/17/17    Authorization - Visit Number  3    Authorization - Number of Visits  13    SLP Start Time  1317    SLP Stop Time   1400    SLP Time Calculation (min)  43 min    Activity Tolerance  Patient tolerated treatment well       Past Medical History:  Diagnosis Date  . Arthritis    BOTH KNEES  . Chronic kidney disease    HX/O  . Depression   . GERD (gastroesophageal reflux disease)   . IBS (irritable bowel syndrome)   . Wears partial dentures    UPPER AND LOWER    Past Surgical History:  Procedure Laterality Date  . CATARACT EXTRACTION W/PHACO Right 08/24/2015   Procedure: CATARACT EXTRACTION PHACO AND INTRAOCULAR LENS PLACEMENT (IOC);  Surgeon: Sherald HessAnita Prakash Vin-Parikh, MD;  [facility];  Service: Ophthalmology;  Laterality: Right;  VISION BLUE RIGHT  . CYSTOSCOPY      There were no vitals filed for this visit.  Subjective Assessment - 12/16/16 1319    Subjective  Pt got out homework with SLP asking for it.    Currently in Pain?  Yes    Pain Score  6     Pain Location  Shoulder    Pain Orientation  Right;Left    Pain Descriptors / Indicators  Sore    Pain Type  Acute pain    Pain Onset  More than a month ago    Pain Frequency  Constant    Aggravating Factors   nothing    Pain Relieving Factors  meds            ADULT SLP TREATMENT - 12/16/16 1323      General Information   Behavior/Cognition   Alert;Cooperative;Pleasant mood      Treatment Provided   Treatment provided  Cognitive-Linquistic      Cognitive-Linquistic Treatment   Treatment focused on  Cognition    Skilled Treatment  Pt had her husband look over her med box and he reportedly found everything correct. SLP engaged pt in simple reasoning and attention to detail task (grocery comparison) and req'd mod A for problem solving (how to reduce $ spent to get under budget) and min A consistently for error awareness. SLP suggested pt have husband look over any banking she may do, pt agreed. Pt became tearful when discussing return to work - pt does not feel she's ready to return in next 4 weeks and SLP agreed with pt.       Assessment / Recommendations / Plan   Plan  Continue with current plan of care      Progression Toward Goals   Progression toward goals  Progressing toward goals       SLP Education - 12/16/16 1415    Education provided  Yes    Education Details  important to have  husband check any banking pt performs, not ready to go back to work in next 4 weeks    Person(s) Educated  Patient    Methods  Explanation    Comprehension  Verbalized understanding       SLP Short Term Goals - 12/16/16 1419      SLP SHORT TERM GOAL #1   Title  Pt will complete simple thought organization/problem solving/reasoning tasks with 80% accuracy given mod cues    Time  3    Period  Weeks    Status  On-going      SLP SHORT TERM GOAL #2   Title  Pt will identify and develop compensatory strategy for functional recall with mod assist    Time  3    Period  Weeks    Status  On-going      SLP SHORT TERM GOAL #3   Title  Pt will fill med organizer accurately, given min cues    Time  3    Period  Weeks    Status  On-going       SLP Long Term Goals - 12/16/16 1419      SLP LONG TERM GOAL #1   Title  Pt will complete level 3/abstract thought organization, problem solving/reasoning tasks with 90% accuracy given min cues     Time  7    Period  Weeks    Status  On-going      SLP LONG TERM GOAL #2   Title  Pt will demonstrate use of compensatory strategy for functional recall at least 2x outside of therapy session    Time  7    Period  Weeks    Status  On-going      SLP LONG TERM GOAL #3   Title  Pt will accurately fill med organizer independently.    Time  7    Period  Weeks    Status  On-going       Plan - 12/16/16 1416    Clinical Impression Statement  Pt present today with cont'd mod cognitive linguistic deficts. Today, emergent awareness, reasoning/problem solving, and attention to detail were demonstrated as impaired. SLP, based on pt's performance today, suggested husband look over any banking pt might do. Pt agreed with SLP she was not ready to return to work in the next four weeks - SLP suspects pt will not be ready at least until February for return to work. Pt would cont to beneift from skilled ST to maximize cognitive linguistic function for as close to return to PLOF as possible.     Speech Therapy Frequency  2x / week    Duration  -- 8 weeks or 12 more visits    Treatment/Interventions  Internal/external aids;Functional tasks;SLP instruction and feedback;Patient/family education;Compensatory techniques;Cognitive reorganization    Potential to Achieve Goals  Good    SLP Home Exercise Plan  reviewed with pt    Consulted and Agree with Plan of Care  Patient       Patient will benefit from skilled therapeutic intervention in order to improve the following deficits and impairments:   Cognitive communication deficit    Problem List There are no active problems to display for this patient.   [facility] Hospital[facility]INKE,Anapaula Severt ,MS, CCC-SLP  12/16/2016, 2:20 PM  [facility] [address] [place], [place], [address] Phone: [phone]   Fax:  [phone]   Name: Chelsea KubaWanda Skinner MRN: [id number] Date of Birth: 03-24-1950

## 2016-12-16 NOTE — Patient Instructions (Signed)
  Please complete the assigned speech therapy homework prior to your next session and return it to the speech therapist at your next visit.  

## 2016-12-21 ENCOUNTER — Other Ambulatory Visit: Payer: Self-pay

## 2016-12-21 ENCOUNTER — Ambulatory Visit: Payer: Worker's Compensation | Attending: Neurosurgery

## 2016-12-21 DIAGNOSIS — R41841 Cognitive communication deficit: Secondary | ICD-10-CM | POA: Diagnosis present

## 2016-12-21 NOTE — Therapy (Signed)
[facility] [address] [place], [place], [address] Phone: [phone]   Fax:  [phone]  Speech Language Pathology Treatment  Patient Details  Name: Chelsea Skinner MRN: [id number] Date of Birth: 1950/08/20 Referring Provider: Dr. Amalia Haileyeb A Bhowmick   Encounter Date: 12/21/2016  End of Session - 12/21/16 2123    Visit Number  4    Number of Visits  13    Date for SLP Re-Evaluation  01/17/17    Authorization Type  evaluation plus 12 visits    Authorization Time Period  expires 01/17/17    Authorization - Visit Number  4    Authorization - Number of Visits  13    SLP Start Time  0934    SLP Stop Time   1015    SLP Time Calculation (min)  41 min    Activity Tolerance  Patient tolerated treatment well       Past Medical History:  Diagnosis Date  . Arthritis    BOTH KNEES  . Chronic kidney disease    HX/O  . Depression   . GERD (gastroesophageal reflux disease)   . IBS (irritable bowel syndrome)   . Wears partial dentures    UPPER AND LOWER    Past Surgical History:  Procedure Laterality Date  . CATARACT EXTRACTION W/PHACO Right 08/24/2015   Procedure: CATARACT EXTRACTION PHACO AND INTRAOCULAR LENS PLACEMENT (IOC);  Surgeon: Sherald HessAnita Prakash Vin-Parikh, MD;  [facility];  Service: Ophthalmology;  Laterality: Right;  VISION BLUE RIGHT  . CYSTOSCOPY      There were no vitals filed for this visit.  Subjective Assessment - 12/21/16 0943    Currently in Pain?  Yes    Pain Score  5     Pain Location  Shoulder    Pain Orientation  Right;Left    Pain Descriptors / Indicators  Sore    Pain Type  Acute pain    Pain Radiating Towards  neck    Pain Onset  More than a month ago    Pain Frequency  Constant    Aggravating Factors   nothing    Pain Relieving Factors  tylenol            ADULT SLP TREATMENT - 12/21/16 0944      General Information   Behavior/Cognition   Alert;Cooperative;Pleasant mood      Treatment Provided   Treatment provided  Cognitive-Linquistic      Cognitive-Linquistic Treatment   Treatment focused on  Cognition    Skilled Treatment  Pt provided homework to SLP upon request. Missed two items on detailed directions without awareness (req'd SLP cues to look again for errors, then once again to re-look to find error/s). Alternating attention between three tasks today with good success, minimal/WNL time to switch tasks. Pt spontaneously double checked her work of simple but detailed written tasks, and did not note the one error she had made.       Assessment / Recommendations / Plan   Plan  Continue with current plan of care      Progression Toward Goals   Progression toward goals  Progressing toward goals         SLP Short Term Goals - 12/21/16 2125      SLP SHORT TERM GOAL #1   Title  Pt will complete simple thought organization/problem solving/reasoning tasks with 80% accuracy given mod cues  (Pended)     Time  2  (Pended)  Period  Weeks  (Pended)     Status  On-going  (Pended)       SLP SHORT TERM GOAL #2   Title  Pt will identify and develop compensatory strategy for functional recall with mod assist  (Pended)     Time  2  (Pended)     Period  Weeks  (Pended)     Status  On-going  (Pended)       SLP SHORT TERM GOAL #3   Title  Pt will fill med organizer accurately, given min cues  (Pended)     Time  2  (Pended)     Period  Weeks  (Pended)     Status  On-going  (Pended)        SLP Long Term Goals - 12/21/16 2141      SLP LONG TERM GOAL #1   Title  Pt will complete level 3/abstract thought organization, problem solving/reasoning tasks with 90% accuracy given min cues    Time  6    Period  Weeks    Status  On-going      SLP LONG TERM GOAL #2   Title  Pt will demonstrate use of compensatory strategy for functional recall at least 2x outside of therapy session    Time  6    Period  Weeks    Status  On-going       SLP LONG TERM GOAL #3   Title  Pt will accurately fill med organizer independently.    Time  6    Period  Weeks    Status  On-going       Plan - 12/21/16 2124    Clinical Impression Statement  Pt presents today with cont'd cognitive linguistic deficts. Emergent awareness and attention to detail were demonstrated as impaired. Pt would cont to beneift from skilled ST to maximize cognitive linguistic function for as close to return to PLOF as possible.     Speech Therapy Frequency  2x / week    Duration  -- 8 weeks or 12 more visits    Treatment/Interventions  Internal/external aids;Functional tasks;SLP instruction and feedback;Patient/family education;Compensatory techniques;Cognitive reorganization    Potential to Achieve Goals  Good    SLP Home Exercise Plan  reviewed with pt    Consulted and Agree with Plan of Care  Patient       Patient will benefit from skilled therapeutic intervention in order to improve the following deficits and impairments:   Cognitive communication deficit    Problem List There are no active problems to display for this patient.   [facility] CenterCHINKE,CARL ,MS, CCC-SLP  12/21/2016, 9:42 PM  [facility] [address] [place], [place], [address] Phone: [phone]   Fax:  [phone]   Name: Chelsea KubaWanda Skinner MRN: [id number] Date of Birth: February 26, 1950

## 2016-12-21 NOTE — Patient Instructions (Signed)
  Please complete the assigned speech therapy homework prior to your next session and return it to the speech therapist at your next visit.  

## 2016-12-23 ENCOUNTER — Other Ambulatory Visit: Payer: Self-pay

## 2016-12-23 ENCOUNTER — Ambulatory Visit: Payer: Worker's Compensation | Attending: Neurosurgery

## 2016-12-23 DIAGNOSIS — R41841 Cognitive communication deficit: Secondary | ICD-10-CM | POA: Diagnosis not present

## 2016-12-26 NOTE — Patient Instructions (Signed)
  Please complete the assigned speech therapy homework prior to your next session and return it to the speech therapist at your next visit.  

## 2016-12-26 NOTE — Therapy (Signed)
[facility] [address] [place], [place], [address] Phone: [phone]   Fax:  [phone]  Speech Language Pathology Treatment  Patient Details  Name: Chelsea KubaWanda Kempton MRN: [id number] Date of Birth: 23-May-1950 Referring Provider: Dr. Amalia Haileyeb A Bhowmick   Encounter Date: 12/23/2016  End of Session - 12/26/16 1259    Visit Number  5    Number of Visits  13    Date for SLP Re-Evaluation  01/17/17    Authorization Type  evaluation plus 12 visits    Authorization Time Period  expires 01/17/17    Authorization - Visit Number  5    Authorization - Number of Visits  13    SLP Start Time  0851    SLP Stop Time   0931    SLP Time Calculation (min)  40 min       Past Medical History:  Diagnosis Date  . Arthritis    BOTH KNEES  . Chronic kidney disease    HX/O  . Depression   . GERD (gastroesophageal reflux disease)   . IBS (irritable bowel syndrome)   . Wears partial dentures    UPPER AND LOWER    Past Surgical History:  Procedure Laterality Date  . CATARACT EXTRACTION W/PHACO Right 08/24/2015   Procedure: CATARACT EXTRACTION PHACO AND INTRAOCULAR LENS PLACEMENT (IOC);  Surgeon: Sherald HessAnita Prakash Vin-Parikh, MD;  [facility];  Service: Ophthalmology;  Laterality: Right;  VISION BLUE RIGHT  . CYSTOSCOPY      There were no vitals filed for this visit.         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 In simple detailed tasks today pt spontaneously double-checked her work but found errors 85% of the time. She provided homework to SLP with one error not detected, when pt stated she double-checked her  homework as well.       Assessment / Recommendations / Plan   Plan  Continue with current plan of care      Progression Toward Goals   Progression toward goals  Progressing toward goals         SLP Short Term Goals - 12/26/16 1300      SLP SHORT TERM GOAL #1   Title  Pt will complete simple thought organization/problem solving/reasoning tasks with 80% accuracy given mod cues    Time  2    Period  Weeks    Status  On-going      SLP SHORT TERM GOAL #2   Title  Pt will identify and develop compensatory strategy for functional recall with mod assist    Time  2    Period  Weeks    Status  On-going      SLP SHORT TERM GOAL #3   Title  Pt will fill med organizer accurately, given min cues    Time  2    Period  Weeks    Status  On-going       SLP Long Term Goals - 12/26/16 1300      SLP LONG TERM GOAL #1   Title  Pt will complete level 3/abstract thought organization, problem solving/reasoning tasks with  90% accuracy given min cues    Time  6    Period  Weeks    Status  On-going      SLP LONG TERM GOAL #2   Title  Pt will demonstrate use of compensatory strategy for functional recall at least 2x outside of therapy session    Time  6    Period  Weeks    Status  On-going      SLP LONG TERM GOAL #3   Title  Pt will accurately fill med organizer independently.    Time  6    Period  Weeks    Status  On-going       Plan - 12/26/16 1259    Clinical Impression Statement  Pt presents today with cont'd cognitive linguistic deficts in emergent awareness and attention to detail. She is spontaneously double checking her work but is not always finding errors. Pt would cont to beneift from skilled ST to maximize cognitive linguistic function for as close to return to PLOF as possible.     Speech Therapy Frequency  2x / week    Duration  -- 8 weeks or 12 more visits    Treatment/Interventions  Internal/external aids;Functional tasks;SLP instruction and feedback;Patient/family  education;Compensatory techniques;Cognitive reorganization    Potential to Achieve Goals  Good    SLP Home Exercise Plan  reviewed with pt    Consulted and Agree with Plan of Care  Patient       Patient will benefit from skilled therapeutic intervention in order to improve the following deficits and impairments:   Cognitive communication deficit    Problem List There are no active problems to display for this patient.   [facility],CARL ,MS, CCC-SLP  12/26/2016, 1:01 PM  [facility] [address] [place], [place], [address] Phone: [phone]   Fax:  [phone]   Name: Chelsea KubaWanda Sellin MRN: [id number] Date of Birth: November 09, 1950

## 2016-12-29 ENCOUNTER — Other Ambulatory Visit: Payer: Self-pay

## 2016-12-29 ENCOUNTER — Ambulatory Visit: Payer: Worker's Compensation | Attending: Neurosurgery | Admitting: Speech Pathology

## 2016-12-29 ENCOUNTER — Encounter: Payer: Self-pay | Admitting: Speech Pathology

## 2016-12-29 DIAGNOSIS — R41841 Cognitive communication deficit: Secondary | ICD-10-CM | POA: Insufficient documentation

## 2016-12-29 NOTE — Therapy (Signed)
[facility] [address] [place], [place], [address] Phone: [phone]   Fax:  [phone]  Speech Language Pathology Treatment  Patient Details  Name: Chelsea Skinner MRN: [id number] Date of Birth: March 19, 1950 Referring Provider: Dr. Amalia Haileyeb A Bhowmick   Encounter Date: 12/29/2016  End of Session - 12/29/16 1740    Visit Number  6    Number of Visits  13    Date for SLP Re-Evaluation  01/17/17    Authorization Type  evaluation plus 12 visits    Authorization Time Period  expires 01/17/17    Authorization - Visit Number  6    Authorization - Number of Visits  13    SLP Start Time  1402    SLP Stop Time   1445    SLP Time Calculation (min)  43 min    Activity Tolerance  Patient tolerated treatment well       Past Medical History:  Diagnosis Date  . Arthritis    BOTH KNEES  . Chronic kidney disease    HX/O  . Depression   . GERD (gastroesophageal reflux disease)   . IBS (irritable bowel syndrome)   . Wears partial dentures    UPPER AND LOWER    Past Surgical History:  Procedure Laterality Date  . CATARACT EXTRACTION W/PHACO Right 08/24/2015   Procedure: CATARACT EXTRACTION PHACO AND INTRAOCULAR LENS PLACEMENT (IOC);  Surgeon: Sherald HessAnita Prakash Vin-Parikh, MD;  [facility];  Service: Ophthalmology;  Laterality: Right;  VISION BLUE RIGHT  . CYSTOSCOPY      There were no vitals filed for this visit.  Subjective Assessment - 12/29/16 1404    Subjective  "It seems to be it's getting some better."    Currently in Pain?  Yes    Pain Score  6     Pain Location  Neck    Pain Descriptors / Indicators  Aching    Pain Radiating Towards  shoulders    Pain Onset  More than a month ago    Pain Frequency  Intermittent    Pain Relieving Factors  moving around            ADULT SLP TREATMENT - 12/29/16 1402      General Information   Behavior/Cognition  Alert;Cooperative;Pleasant mood    Patient  Positioning  Upright in chair      Treatment Provided   Treatment provided  Cognitive-Linquistic      Cognitive-Linquistic Treatment   Treatment focused on  Cognition    Skilled Treatment  Pt showed SLP her home exercises (checkbook register) which she reported she had to write several times due to catching errors in organization. 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 She stated she hopes she can go back to work but "my son doesn't want me going to work." SLP targeted problem solving with simple time word problems. Pt achieved 70% accuracy, requiring extended time and mod A. Pt checked her responses and demo'd awareness of 1/3 errors.       Assessment / Recommendations / Plan   Plan  Continue with current plan of care      Progression Toward Goals   Progression toward goals  Progressing toward goals         SLP Short Term Goals - 12/29/16 1658      SLP SHORT TERM GOAL #1   Title  Pt will complete simple thought organization/problem solving/reasoning tasks with 80% accuracy given mod cues  Time  2    Period  Weeks    Status  On-going      SLP SHORT TERM GOAL #2   Title  Pt will identify and develop compensatory strategy for functional recall with mod assist    Time  2    Period  Weeks    Status  On-going      SLP SHORT TERM GOAL #3   Title  Pt will fill med organizer accurately, given min cues    Time  2    Period  Weeks    Status  On-going       SLP Long Term Goals - 12/29/16 1658      SLP LONG TERM GOAL #1   Title  Pt will complete level 3/abstract thought organization, problem solving/reasoning tasks with 90% accuracy given min cues    Time  6    Period  Weeks    Status  On-going      SLP LONG TERM GOAL #2   Title  Pt will demonstrate use of compensatory strategy for functional recall at least 2x outside of therapy session    Time  6    Period  Weeks    Status  On-going      SLP LONG TERM GOAL #3   Title  Pt will  accurately fill med organizer independently.    Time  6    Period  Weeks    Status  On-going       Plan - 12/29/16 1748    Clinical Impression Statement  Pt presents today with cont'd cognitive linguistic deficts in emergent awareness and attention to detail. She is spontaneously double checking her work but is not always finding errors. Pt would cont to beneift from skilled ST to maximize cognitive linguistic function for as close to return to PLOF as possible.     Speech Therapy Frequency  2x / week    Treatment/Interventions  Internal/external aids;Functional tasks;SLP instruction and feedback;Patient/family education;Compensatory techniques;Cognitive reorganization    Potential to Achieve Goals  Good    Potential Considerations  Ability to learn/carryover information;Cooperation/participation level;Previous level of function;Family/community support;Severity of impairments    SLP Home Exercise Plan  reviewed with pt    Consulted and Agree with Plan of Care  Patient       Patient will benefit from skilled therapeutic intervention in order to improve the following deficits and impairments:   Cognitive communication deficit    Problem List There are no active problems to display for this patient.  Rondel BatonMary Beth Karam [place], [place], CCC-SLP Speech-Language Pathologist   [name] 12/29/2016, 5:49 PM  [facility] [address] [place], [place], [address] Phone: [phone]   Fax:  [phone]   Name: Chelsea Skinner MRN: [id number] Date of Birth: October 22, 1950

## 2016-12-29 NOTE — Patient Instructions (Signed)
Plan an after school program day. Make a schedule for your 3 different groups of children. The plan must include a winter-themed craft activity that can be completed for less than $1.00 cents per child.

## 2017-01-05 ENCOUNTER — Encounter: Payer: Self-pay | Admitting: Speech Pathology

## 2017-01-09 ENCOUNTER — Ambulatory Visit: Payer: Worker's Compensation | Attending: Neurosurgery | Admitting: Speech Pathology

## 2017-01-09 ENCOUNTER — Other Ambulatory Visit: Payer: Self-pay

## 2017-01-09 DIAGNOSIS — R41841 Cognitive communication deficit: Secondary | ICD-10-CM

## 2017-01-09 NOTE — Therapy (Signed)
[facility] [address] [place], [place], [address] Phone: [phone]   Fax:  [phone]  Speech Language Pathology Treatment  Patient Details  Name: Chelsea Skinner MRN: [id number] Date of Birth: Sep 21, 1950 Referring Provider: Dr. Amalia Hailey   Encounter Date: 01/09/2017  End of Session - 01/09/17 0929    Visit Number  7    Number of Visits  13    Date for SLP Re-Evaluation  01/17/17    Authorization Type  evaluation plus 12 visits    Authorization Time Period  expires 01/17/17    Authorization - Visit Number  7    Authorization - Number of Visits  13    SLP Start Time  0848    SLP Stop Time   0929    SLP Time Calculation (min)  41 min       Past Medical History:  Diagnosis Date  . Arthritis    BOTH KNEES  . Chronic kidney disease    HX/O  . Depression   . GERD (gastroesophageal reflux disease)   . IBS (irritable bowel syndrome)   . Wears partial dentures    UPPER AND LOWER    Past Surgical History:  Procedure Laterality Date  . CATARACT EXTRACTION W/PHACO Right 08/24/2015   Procedure: CATARACT EXTRACTION PHACO AND INTRAOCULAR LENS PLACEMENT (IOC);  Surgeon: Sherald Hess, MD;  [facility];  Service: Ophthalmology;  Laterality: Right;  VISION BLUE RIGHT  . CYSTOSCOPY      There were no vitals filed for this visit.  Subjective Assessment - 01/09/17 0859    Subjective  "I like stuff like this." pt, re: word problems she did for homework    Pain Score  4     Pain Location  Neck    Pain Orientation  Medial;Proximal    Pain Descriptors / Indicators  Aching    Pain Type  Chronic pain    Pain Onset  More than a month ago    Pain Frequency  Intermittent    Aggravating Factors   nothing    Pain Relieving Factors  moving around            ADULT SLP TREATMENT - 01/09/17 0848      General Information   Behavior/Cognition  Alert;Cooperative;Pleasant mood    Patient  Positioning  Upright in chair      Treatment Provided   Treatment provided  Cognitive-Linquistic      Pain Assessment   Pain Assessment  No/denies pain      Cognitive-Linquistic Treatment   Treatment focused on  Cognition    Skilled Treatment  Pt showed SLP and described plans she made for 3 craft activities for afterschool groups. Pt explained reasoning for choosing crafts for each group including choice of materials within budget limits. SLP facilitated organization via checkbook balancing activity. Pt required extended time, and was 50% accurate. She required mod cues to identify errors but was unable to correct. SLP noted pt was adding and subtracting numbers in a disorganized fashion. Demo'd strategy to keep a running balance to reduce chances of losing her place or making errors. Pt used this technique to balance checkbook successfully with rare min A. Home tasks assigned.      Assessment / Recommendations / Plan   Plan  Continue with current plan of care      Progression Toward Goals   Progression toward goals  Progressing toward goals       SLP  Education - 01/09/17 [phone]    Education provided  Yes    Education Details  one step at a time; have husband check banking    Person(s) Educated  Patient    Methods  Explanation    Comprehension  Verbalized understanding       SLP Short Term Goals - 01/09/17 0930      SLP SHORT TERM GOAL #1   Title  Pt will complete simple thought organization/problem solving/reasoning tasks with 80% accuracy given mod cues    Time  1    Status  On-going      SLP SHORT TERM GOAL #2   Title  Pt will identify and develop compensatory strategy for functional recall with mod assist    Time  1    Period  Weeks    Status  On-going      SLP SHORT TERM GOAL #3   Title  Pt will fill med organizer accurately, given min cues    Time  1    Period  Weeks    Status  On-going       SLP Long Term Goals - 01/09/17 0930      SLP LONG TERM GOAL #1    Title  Pt will complete level 3/abstract thought organization, problem solving/reasoning tasks with 90% accuracy given min cues    Time  5    Period  Weeks    Status  On-going      SLP LONG TERM GOAL #2   Title  Pt will demonstrate use of compensatory strategy for functional recall at least 2x outside of therapy session    Time  5    Period  Weeks    Status  On-going      SLP LONG TERM GOAL #3   Title  Pt will accurately fill med organizer independently.    Time  5    Period  Weeks    Status  On-going       Plan - 01/09/17 0930    Clinical Impression Statement  Pt presents today with cont'd cognitive linguistic deficts in emergent awareness and attention to detail. She is spontaneously double checking her work but is not always finding errors. Pt would cont to beneift from skilled ST to maximize cognitive linguistic function for as close to return to PLOF as possible.     Speech Therapy Frequency  2x / week    Treatment/Interventions  Internal/external aids;Functional tasks;SLP instruction and feedback;Patient/family education;Compensatory techniques;Cognitive reorganization    Potential to Achieve Goals  Good    Potential Considerations  Ability to learn/carryover information;Cooperation/participation level;Previous level of function;Family/community support;Severity of impairments    SLP Home Exercise Plan  reviewed with pt    Consulted and Agree with Plan of Care  Patient       Patient will benefit from skilled therapeutic intervention in order to improve the following deficits and impairments:   Cognitive communication deficit    Problem List There are no active problems to display for this patient.  Rondel BatonMary Beth Noam Karaffa, [place], CCC-SLP Speech-Language Pathologist   Arlana LindauMary E Khamiya Varin 01/09/2017, 9:31 AM  [facility] [address] [place], [place], [address] Phone: [phone]   Fax:  [phone]   Name: Chelsea KubaWanda  Skinner MRN: [id number] Date of Birth: December 02, 1950

## 2017-01-12 ENCOUNTER — Ambulatory Visit: Payer: Worker's Compensation | Attending: Neurosurgery | Admitting: Speech Pathology

## 2017-01-12 ENCOUNTER — Other Ambulatory Visit: Payer: Self-pay

## 2017-01-12 DIAGNOSIS — R41841 Cognitive communication deficit: Secondary | ICD-10-CM | POA: Insufficient documentation

## 2017-01-12 NOTE — Therapy (Signed)
[facility] [address] [place], [place], [address] Phone: [phone]   Fax:  [phone]  Speech Language Pathology Treatment  Patient Details  Name: Chelsea Skinner MRN: [id number] Date of Birth: 04-05-1950 Referring Provider: Dr. Amalia Hailey   Encounter Date: 01/12/2017  End of Session - 01/12/17 1712    Visit Number  8    Number of Visits  13    Date for SLP Re-Evaluation  01/17/17    Authorization Type  evaluation plus 12 visits    Authorization Time Period  expires 01/17/17    Authorization - Visit Number  8    Authorization - Number of Visits  13    SLP Start Time  1617    SLP Stop Time   1659    SLP Time Calculation (min)  42 min    Activity Tolerance  Patient tolerated treatment well       Past Medical History:  Diagnosis Date  . Arthritis    BOTH KNEES  . Chronic kidney disease    HX/O  . Depression   . GERD (gastroesophageal reflux disease)   . IBS (irritable bowel syndrome)   . Wears partial dentures    UPPER AND LOWER    Past Surgical History:  Procedure Laterality Date  . CATARACT EXTRACTION W/PHACO Right 08/24/2015   Procedure: CATARACT EXTRACTION PHACO AND INTRAOCULAR LENS PLACEMENT (IOC);  Surgeon: Sherald Hess, MD;  Location: [facility];  Service: Ophthalmology;  Laterality: Right;  VISION BLUE RIGHT  . CYSTOSCOPY      There were no vitals filed for this visit.  Subjective Assessment - 01/12/17 1631    Subjective  "I can tell I'm getting better."    Currently in Pain?  No/denies            ADULT SLP TREATMENT - 01/12/17 1417      General Information   Behavior/Cognition  Alert;Cooperative;Pleasant mood    Patient Positioning  Upright in chair      Treatment Provided   Treatment provided  Cognitive-Linquistic      Pain Assessment   Pain Assessment  No/denies pain      Cognitive-Linquistic Treatment   Treatment focused on  Cognition    Skilled  Treatment  Pt showed SLP checkbook register HW which was 100% accurate; pt reports she completed as instructed, one step at a time. Pt described her system for keeping track of her appointments and schedules by keeping information in one place with her calendar on her refrigerator, keeping notes in her purse. She was able to tell SLP next appointment date independently. SLP facilitated simple problem solving/reasoning with time and money problems. 85% accuracy for simple time problems, 80% accuracy for money problems; extended time required. Pt required min A for error awareness, though mod A required for error correction.       Assessment / Recommendations / Plan   Plan  Continue with current plan of care;Goals updated      Progression Toward Goals   Progression toward goals  Progressing toward goals         SLP Short Term Goals - 01/12/17 1623      SLP SHORT TERM GOAL #1   Title  Pt will complete simple thought organization/problem solving/reasoning tasks with 80% accuracy given mod cues    Time  1    Period  Weeks    Status  Achieved      SLP SHORT TERM GOAL #2  Title  Pt will identify and develop compensatory strategy for functional recall with mod assist    Time  1    Period  Weeks    Status  Achieved      SLP SHORT TERM GOAL #3   Title  Pt will fill med organizer accurately, given min cues    Time  1    Period  Weeks    Status  Deferred Pt reports she is taking her medications on schedule with husband's supervision       SLP Long Term Goals - 01/12/17 1708      SLP LONG TERM GOAL #1   Title  Pt will complete moderately complex time/money, reasoning/problem solving tasks with 90% accuracy.    Status  Revised      SLP LONG TERM GOAL #2   Title  Pt will report use of compensatory aids/strategy for functional recall at least 2x outside of therapy session    Status  Revised      SLP LONG TERM GOAL #3   Title  Pt will accurately fill med organizer independently.    Time   5    Period  Weeks    Status  Deferred      SLP LONG TERM GOAL #4   Title  Pt will identify and correct errors in cognitive linguistic tasks in 80% of opportunities.    Time  5    Period  Weeks    Status  New       Plan - 01/12/17 1713    Clinical Impression Statement  Pt presents today with cont'd cognitive linguistic deficits in problem solving, emergent awareness and attention to detail. Error identification improving however pt cont to require cues to correct errors. Pt would cont to benefit from skilled ST to maximize cognitive linguistic function for as close to return to PLOF as possible.     Speech Therapy Frequency  2x / week    Treatment/Interventions  Internal/external aids;Functional tasks;SLP instruction and feedback;Patient/family education;Compensatory techniques;Cognitive reorganization    Potential to Achieve Goals  Good    Potential Considerations  Ability to learn/carryover information;Cooperation/participation level;Previous level of function;Family/community support;Severity of impairments    Consulted and Agree with Plan of Care  Patient       Patient will benefit from skilled therapeutic intervention in order to improve the following deficits and impairments:   Cognitive communication deficit    Problem List There are no active problems to display for this patient.  Rondel BatonMary Beth Tyesha Joffe, [place], CCC-SLP Speech-Language Pathologist  Arlana LindauMary E Renn Dirocco 01/12/2017, 5:16 PM  [facility] [address] [place], [place], [address] Phone: [phone]   Fax:  [phone]   Name: Chelsea Skinner MRN: [id number] Date of Birth: [birth year]

## 2017-01-16 ENCOUNTER — Ambulatory Visit: Payer: Worker's Compensation | Attending: Neurosurgery

## 2017-01-16 DIAGNOSIS — R41841 Cognitive communication deficit: Secondary | ICD-10-CM | POA: Diagnosis not present

## 2017-01-16 NOTE — Therapy (Signed)
[facility] [address] [place], [place], [address] Phone: [phone]   Fax:  [phone]  Speech Language Pathology Treatment  Patient Details  Name: Chelsea Skinner MRN: [id number] Date of Birth: March 10, 1950 Referring Provider: Dr. Amalia Hailey   Encounter Date: 01/16/2017  End of Session - 01/16/17 1313    Visit Number  9    Number of Visits  21    Date for SLP Re-Evaluation  03/03/17    Authorization Type  evaluation plus 12 visits -progress/therapy note faxed 01-16-17    Authorization Time Period  expires 01/17/17    Authorization - Visit Number  9    Authorization - Number of Visits  13    SLP Start Time  1150    SLP Stop Time   1230    SLP Time Calculation (min)  40 min    Activity Tolerance  Patient tolerated treatment well       Past Medical History:  Diagnosis Date  . Arthritis    BOTH KNEES  . Chronic kidney disease    HX/O  . Depression   . GERD (gastroesophageal reflux disease)   . IBS (irritable bowel syndrome)   . Wears partial dentures    UPPER AND LOWER    Past Surgical History:  Procedure Laterality Date  . CATARACT EXTRACTION W/PHACO Right 08/24/2015   Procedure: CATARACT EXTRACTION PHACO AND INTRAOCULAR LENS PLACEMENT (IOC);  Surgeon: Sherald Hess, MD;  Location: [facility];  Service: Ophthalmology;  Laterality: Right;  VISION BLUE RIGHT  . CYSTOSCOPY      There were no vitals filed for this visit.  Subjective Assessment - 01/16/17 1155    Subjective  "I'm doing word search at home." "I just don't know" (re: if pt is ready for d/c). SLP to ask for auth for more visits today.    Currently in Pain?  Yes    Pain Score  6     Pain Location  Head    Pain Orientation  Mid    Pain Descriptors / Indicators  Headache    Pain Type  Acute pain    Pain Radiating Towards  neck    Pain Onset  Today    Pain Frequency  Constant    Aggravating Factors   cold weather    Pain Relieving Factors  meds    Effect of Pain on Daily Activities  went back to bed today            ADULT SLP TREATMENT - 01/16/17 1158      General Information   Behavior/Cognition  Alert;Cooperative;Pleasant mood      Treatment Provided   Treatment provided  Cognitive-Linquistic      Cognitive-Linquistic Treatment   Treatment focused on  Cognition    Skilled Treatment  Pt reports continuing to use schedule on refridgerator for appointment tracking. Medication administration continues to go well. Pt uses the medication refill reminders from her pharmacy to complete refills and has done so successfully for recent refills. In mod complex organization/reasoning/attention to detail tasks today pt demonstrated need for improved error awareness (3 obvious parameters were not followed). Extra time req'd. Pt was cued that there were errors but with error awareness continued decreased, she req'd SLP mod cues to find errors.      Assessment / Recommendations / Plan   Plan  Continue with current plan of care      Progression Toward Goals   Progression toward goals  Progressing toward goals         SLP Short Term Goals - 01/16/17 1318      SLP SHORT TERM GOAL #1   Title  Pt will complete simple thought organization/problem solving/reasoning tasks with 80% accuracy given mod cues    Status  Achieved      SLP SHORT TERM GOAL #2   Title  Pt will identify and develop compensatory strategy for functional recall with mod assist    Status  Achieved      SLP SHORT TERM GOAL #3   Title  Pt will fill med organizer accurately, given min cues    Status  Deferred Pt reports she is taking her medications on schedule       SLP Long Term Goals - 01/16/17 1201      SLP LONG TERM GOAL #1   Title  Pt will complete moderately complex time/money, reasoning/problem solving tasks with 90% accuracy.    Time  4    Period  Weeks    Status  On-going      SLP LONG TERM GOAL #2   Title  Pt will  report use of compensatory aids/strategy for functional recall at least 2x outside of therapy session    Time  4    Period  Weeks    Status  On-going      SLP LONG TERM GOAL #3   Title  Pt will accurately fill med organizer independently.    Status  Deferred      SLP LONG TERM GOAL #4   Title  Pt will identify and correct errors in cognitive linguistic tasks in 80% of opportunities.    Time  4    Period  Weeks    Status  On-going       Plan - 01/16/17 1313    Clinical Impression Statement  Pt's cognitive linguistic deficits in problem solving, emergent awareness and attention to detail persist. Error identification slowly improving however pt cont to require mod cues to correct errors in mod complex cognitive linguistic tasks. In opinion of this SLP, pt is not ready to return to work and would cont to benefit from skilled ST to maximize cognitive linguistic function for as close to return to PLOF as possible. 12 more visits are recommended.    Speech Therapy Frequency  2x / week    Duration  4 weeks    Treatment/Interventions  Internal/external aids;Functional tasks;SLP instruction and feedback;Patient/family education;Compensatory techniques;Cognitive reorganization    Potential to Achieve Goals  Good    Potential Considerations  Ability to learn/carryover information;Cooperation/participation level;Previous level of function;Family/community support;Severity of impairments    Consulted and Agree with Plan of Care  Patient       Patient will benefit from skilled therapeutic intervention in order to improve the following deficits and impairments:   Cognitive communication deficit    Problem List There are no active problems to display for this patient.   [facility],Kelsie Zaborowski ,MS, CCC-SLP  01/16/2017, 1:19 PM  [facility] [address] [place], [place], [address] Phone: [phone]   Fax:  [phone]   Name: Chelsea KubaWanda  Skinner MRN: [id number] Date of Birth: 01-Feb-1950

## 2017-01-16 NOTE — Patient Instructions (Signed)
  Please complete the assigned speech therapy homework prior to your next session and return it to the speech therapist at your next visit.  

## 2017-01-19 ENCOUNTER — Ambulatory Visit: Payer: Worker's Compensation | Admitting: Speech Pathology

## 2017-01-23 ENCOUNTER — Encounter: Payer: Self-pay | Admitting: Speech Pathology

## 2017-01-26 ENCOUNTER — Encounter: Payer: Self-pay | Admitting: Speech Pathology

## 2017-01-30 ENCOUNTER — Encounter: Payer: Self-pay | Admitting: Speech Pathology

## 2017-02-06 ENCOUNTER — Encounter: Payer: Self-pay | Admitting: Speech Pathology

## 2017-02-09 ENCOUNTER — Encounter: Payer: Self-pay | Admitting: Speech Pathology

## 2017-02-13 ENCOUNTER — Encounter: Payer: Self-pay | Admitting: Speech Pathology

## 2017-02-16 ENCOUNTER — Encounter: Payer: Self-pay | Admitting: Speech Pathology

## 2017-02-20 ENCOUNTER — Encounter: Payer: Self-pay | Admitting: Speech Pathology

## 2017-02-23 ENCOUNTER — Encounter: Payer: Self-pay | Admitting: Speech Pathology

## 2017-09-12 ENCOUNTER — Encounter: Payer: Self-pay | Admitting: Speech Pathology

## 2017-09-12 NOTE — Therapy (Unsigned)
SPEECH THERAPY DISCHARGE SUMMARY  Visits from Start of Care: 9  Current functional level related to goals / functional outcomes: See goals below.   Remaining deficits: At time of last appointment 01/16/17, pt's cognitive linguistic deficits in problem solving, emergent awareness and attention to detail persist.   Education / Equipment: Deficit areas, cognitive activities for home Plan: Patient agrees to discharge.  Patient goals were partially met. Patient is being discharged due to the patient's request.  ?????        SLP Short Term Goals - 09/12/17 1035      SLP SHORT TERM GOAL #1   Title  Pt will complete simple thought organization/problem solving/reasoning tasks with 80% accuracy given mod cues    Status  Achieved      SLP SHORT TERM GOAL #2   Title  Pt will identify and develop compensatory strategy for functional recall with mod assist    Status  Achieved      SLP SHORT TERM GOAL #3   Title  Pt will fill med organizer accurately, given min cues    Status  Deferred      SLP Long Term Goals - 09/12/17 1035      SLP LONG TERM GOAL #1   Title  Pt will complete moderately complex time/money, reasoning/problem solving tasks with 90% accuracy.    Status  Not Met      SLP LONG TERM GOAL #2   Title  Pt will report use of compensatory aids/strategy for functional recall at least 2x outside of therapy session    Status  Not Met      SLP LONG TERM GOAL #3   Title  Pt will accurately fill med organizer independently.    Status  Deferred      SLP LONG TERM GOAL #4   Title  Pt will identify and correct errors in cognitive linguistic tasks in 80% of opportunities.    Status  Not [facility] [address] [place], [place], [address] Phone: [phone]   Fax:  [phone]  Patient Details  Name: Chelsea Skinner MRN: [id number] Date of Birth: 1950/06/25 Referring Provider:  No ref. provider  found  Encounter Date: 09/12/2017  Deneise Lever, [place], [place] 09/12/2017, 10:35 AM  [facility] [address] [place], [place], [address] Phone: [phone]   Fax:  [phone]

## 2018-12-25 ENCOUNTER — Telehealth: Payer: Self-pay

## 2018-12-25 NOTE — Telephone Encounter (Signed)
Attempted to call patient in regards to referral sent to our office for [facility]. No answer, no voicemail.

## 2019-01-02 ENCOUNTER — Telehealth: Payer: Self-pay

## 2019-01-02 NOTE — Telephone Encounter (Signed)
Spoke with patient. Dr. Tamala Julian provided verbal order after reviewing patient chart that she would best be taken care of through [facility] vs. [facility] due to history of ongoing symptoms. Patient voices understanding.

## 2019-01-09 ENCOUNTER — Encounter: Payer: Self-pay | Admitting: Neurology

## 2019-01-18 NOTE — Progress Notes (Addendum)
NEUROLOGY CONSULTATION NOTE  Chelsea Skinner MRN: [id number] DOB: [birth year]  Referring provider: Angela Corynne Scibilia, PA Primary care provider: Amy Peggye Form, PA  Reason for consult:  Dizziness, concussion  HISTORY OF PRESENT ILLNESS: Chelsea Skinner is a 69 year old -hanrightded female who presents for syncope and dizziness.  History supplemented by hospital and referring provider notes.  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  She had loss of consciousness and was admitted to [facility].  It was unknown if it was a mechanical fall or if she had passed out prior to the fall.  CT head showed left temporal intraparenchymal hemorrhage, subdural hematoma with 3 mm shift and subarachnoid hemorrhage along left vertex and right parietal lobe.  CT of maxillofacial revealed right sided facial fractures including minimally displaced right lateral orbital wall fracture, fracture of right greater wing of sphenoid bone, and nondisplaced fractures of the right zygomatic arch and right nasal bone.  All findings did not require surgical intervention.  Echocardiogram showed EF 60-65%.  She has undergone ongoing rehab, including speech and cognitive rehab.  She has history of depression and anxiety with panic attacks.  Her depression increased following the accident.    About 3 weeks ago, she didn't feel well.  She felt nauseous.  She said that her head "didn't feel right" but it was not a headache.  No lightheadedness or diaphoresis.  She went to bed.  When she woke up, she felt that she had chest pressure.  She got up, she felt dizzy and diaphoretic.  She started to walk to get her husband and the next thing she knew, she was laying on the floor, maybe for 10 to 15 minutes.  She still felt sick.  No associated slurred speech, visual disturbance or unilateral numbness and weakness.  Afterward, she had  vertigo.  When she would start to lay down, she had spinning sensation which would subside after a few seconds.  However, it would start again when she would get up.  When she would get up out of her chair, she felt like she was being pulled to the left.  She saw her doctor a week later.  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  She still has headaches since her initial head trauma.  They are typically moderate (sometimes severe) throbbing pain on either temple or across forehead, usually lasting a day and occurring every week.  She treats with Tylenol  PAST MEDICAL HISTORY: Past Medical History:  Diagnosis Date  . Arthritis    BOTH KNEES  . Chronic kidney disease    HX/O  . Depression   . GERD (gastroesophageal reflux disease)   . IBS (irritable bowel syndrome)   . Wears partial dentures    UPPER AND LOWER    PAST SURGICAL HISTORY: Past Surgical History:  Procedure Laterality Date  . CATARACT EXTRACTION W/PHACO Right 08/24/2015   Procedure: CATARACT EXTRACTION PHACO AND INTRAOCULAR LENS PLACEMENT (IOC);  Surgeon: Sherald Hess, MD;  [facility];  Service: Ophthalmology;  Laterality: Right;  VISION BLUE RIGHT  . CYSTOSCOPY      MEDICATIONS: Current Outpatient Medications on File Prior to Visit  Medication Sig  Dispense Refill  . dicyclomine (BENTYL) 10 MG capsule Take 10 mg by mouth at bedtime.     Marland Kitchen HYDROcodone-acetaminophen (NORCO/VICODIN) 5-325 MG tablet Take 1 tablet by mouth every 4 (four) hours as needed for moderate pain. 20 tablet 0  . Multiple Vitamin (MULTIVITAMIN) tablet Take 1 tablet by mouth daily. AM    . Omega-3 Fatty Acids (FISH OIL) 1000 MG CAPS Take 1 capsule by mouth. AM    . venlafaxine XR (EFFEXOR-XR) 150 MG 24 hr capsule Take 150 mg by mouth at  bedtime.     No current facility-administered medications on file prior to visit.    ALLERGIES: Allergies  Allergen Reactions  . Penicillins Rash  . Sulfa Antibiotics Nausea Only    FAMILY HISTORY: History reviewed. No pertinent family history.  SOCIAL HISTORY: Social History   Socioeconomic History  . Marital status: Single    Spouse name: Not on file  . Number of children: Not on file  . Years of education: Not on file  . Highest education level: Not on file  Occupational History  . Not on file  Tobacco Use  . Smoking status: Never Smoker  . Smokeless tobacco: Never Used  Substance and Sexual Activity  . Alcohol use: No  . Drug use: No  . Sexual activity: Not on file  Other Topics Concern  . Not on file  Social History Narrative  . Not on file   Social Determinants of Health   Financial Resource Strain:   . Difficulty of Paying Living Expenses: Not on file  Food Insecurity:   . Worried About Charity fundraiser in the Last Year: Not on file  . Ran Out of Food in the Last Year: Not on file  Transportation Needs:   . Lack of Transportation (Medical): Not on file  . Lack of Transportation (Non-Medical): Not on file  Physical Activity:   . Days of Exercise per Week: Not on file  . Minutes of Exercise per Session: Not on file  Stress:   . Feeling of Stress : Not on file  Social Connections:   . Frequency of Communication with Friends and Family: Not on file  . Frequency of Social Gatherings with Friends and Family: Not on file  . Attends Religious Services: Not on file  . Active Member of Clubs or Organizations: Not on file  . Attends Archivist Meetings: Not on file  . Marital Status: Not on file  Intimate Partner Violence:   . Fear of Current or Ex-Partner: Not on file  . Emotionally Abused: Not on file  . Physically Abused: Not on file  . Sexually Abused: Not on file    REVIEW OF SYSTEMS: Constitutional: No fevers, chills, or sweats, no  generalized fatigue, change in appetite Eyes: No visual changes, double vision, eye pain Ear, nose and throat: No hearing loss, ear pain, nasal congestion, sore throat Cardiovascular: No chest pain, palpitations Respiratory:  No shortness of breath at rest or with exertion, wheezes GastrointestinaI: No nausea, vomiting, diarrhea, abdominal pain, fecal incontinence Genitourinary:  No dysuria, urinary retention or frequency Musculoskeletal:  No neck pain, back pain Integumentary: No rash, pruritus, skin lesions Neurological: as above Psychiatric: No depression, insomnia, anxiety Endocrine: No palpitations, fatigue, diaphoresis, mood swings, change in appetite, change in weight, increased thirst Hematologic/Lymphatic:  No purpura, petechiae. Allergic/Immunologic: no itchy/runny eyes, nasal congestion, recent allergic reactions, rashes  PHYSICAL EXAM: Blood pressure (!) 143/83, pulse 97, temperature 98.2 F (36.8 C), height 5\' 8"  (  1.727 m), weight 185 lb 9.6 oz (84.2 kg), SpO2 98 %. General: No acute distress.  Patient appears well-groomed.   Head:  Normocephalic/atraumatic Eyes:  fundi examined but not visualized Neck: supple, no paraspinal tenderness, full range of motion Back: No paraspinal tenderness Heart: regular rate and rhythm Lungs: Clear to auscultation bilaterally. Vascular: No carotid bruits. Neurological Exam: Mental status: alert and oriented to person, place, and time, recent and remote memory intact, fund of knowledge intact, attention and concentration intact, speech fluent and not dysarthric, language intact. Cranial nerves: CN I: not tested CN II: pupils equal, round and reactive to light, visual fields intact CN III, IV, VI:  full range of motion, no nystagmus, no ptosis CN V: facial sensation intact CN VII: upper and lower face symmetric CN VIII: hearing intact CN IX, X: gag intact, uvula midline CN XI: sternocleidomastoid and trapezius muscles intact CN XII:  tongue midline Bulk & Tone: normal, no fasciculations. Motor:  5/5 throughout  Sensation: temperature and vibration sensation intact. Deep Tendon Reflexes:  2+ throughout, toes downgoing.  Finger to nose testing:  Without dysmetria.  Heel to shin:  Without dysmetria.  Gait:  Normal station and stride.  Able to turn and tandem walk. Romberg negative.  IMPRESSION: 1.  Episode of syncope with positional vertigo.  The vertigo may simply be positioanI would evaluate for cerebrovascular event 2.  Tiny left sided subdural hematoma, post-traumatic.  Unclear if residual from prior TBI or recent fall. 3.  Cerebrovascular disease.  Remote left temporal lobe infarct, but new compared to 2018. 4.  History of TBI  PLAN: 1.  MRI of brain and MRA of head and neck 2.  Echocardiogram 3.  Lipid panel 4.  Further recommendations pending results. 5.  Further recommendations pending results.  Thank you for allowing me to take part in the care of this patient.  Shon Millet, DO  CC: Marianna Payment, [place]  02/25/2019 ADDENDUM: MRI of brain shows area of remote trauma.  Questionable subdural blood on CT was artifact.  No vertebrobasilar insufficiency/stenosis on MRA of head.  Echocardiogram showed no structural cardiac source of emboli.  Her symptoms do not sound like seizure.  I recommended referral to cardiology but patient declined.  No further neurologic workup warranted. Shon Millet, DO

## 2019-01-21 ENCOUNTER — Ambulatory Visit: Payer: Medicare HMO | Admitting: Neurology

## 2019-01-21 ENCOUNTER — Encounter: Payer: Self-pay | Admitting: Neurology

## 2019-01-21 ENCOUNTER — Other Ambulatory Visit: Payer: Self-pay

## 2019-01-21 VITALS — BP 143/83 | HR 97 | Temp 98.2°F | Ht 68.0 in | Wt 185.6 lb

## 2019-01-21 DIAGNOSIS — I639 Cerebral infarction, unspecified: Secondary | ICD-10-CM | POA: Diagnosis not present

## 2019-01-21 DIAGNOSIS — R55 Syncope and collapse: Secondary | ICD-10-CM

## 2019-01-21 DIAGNOSIS — R42 Dizziness and giddiness: Secondary | ICD-10-CM | POA: Diagnosis not present

## 2019-01-21 DIAGNOSIS — Z87820 Personal history of traumatic brain injury: Secondary | ICD-10-CM

## 2019-01-21 NOTE — Patient Instructions (Signed)
Unclear if you had a light stroke but we should definitely work it up. MRI of brain without contrast MRA of head and neck Echocardiogram with bubble study Lipid panel Follow up in 4 months.

## 2019-01-30 ENCOUNTER — Encounter (HOSPITAL_COMMUNITY): Payer: Self-pay | Admitting: Radiology

## 2019-02-08 ENCOUNTER — Ambulatory Visit
Admission: RE | Admit: 2019-02-08 | Discharge: 2019-02-08 | Disposition: A | Discharge status: Home / Self Care | Payer: Medicare HMO | Source: Ambulatory Visit | Attending: Neurology | Admitting: Neurology

## 2019-02-08 DIAGNOSIS — R42 Dizziness and giddiness: Secondary | ICD-10-CM

## 2019-02-08 DIAGNOSIS — R55 Syncope and collapse: Secondary | ICD-10-CM

## 2019-02-20 ENCOUNTER — Other Ambulatory Visit: Payer: Self-pay

## 2019-02-20 ENCOUNTER — Ambulatory Visit (HOSPITAL_COMMUNITY): Payer: Medicare HMO | Attending: Cardiology

## 2019-02-20 DIAGNOSIS — R42 Dizziness and giddiness: Secondary | ICD-10-CM | POA: Diagnosis present

## 2019-02-20 DIAGNOSIS — R55 Syncope and collapse: Secondary | ICD-10-CM | POA: Diagnosis present

## 2019-02-20 MED ORDER — SODIUM CHLORIDE 0.9% FLUSH
16.0000 mL | INTRAVENOUS | Status: AC | PRN
  Administered 2019-02-20: 16 mL

## 2019-05-23 NOTE — Progress Notes (Signed)
NEUROLOGY FOLLOW UP OFFICE NOTE  Chelsea Skinner [id number]  HISTORY OF PRESENT ILLNESS: Chelsea Skinner is a 69 year old -hanrightded female who follows up for syncope and dizziness.    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  Findings on prior CT believed to have been artifact.  Echocardiogram from 02/20/2019 was unremarkable with EF 55-60%.   No syncopal episodes.  When she gets up, she feels a little dizzy but nothing significant.    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  She had loss of consciousness and was admitted to [facility].  It was unknown if it was a mechanical fall or if she had passed out prior to the fall.  CT head showed left temporal intraparenchymal hemorrhage, subdural hematoma with 3 mm shift and subarachnoid hemorrhage along left vertex and right parietal lobe.  CT of maxillofacial revealed right sided facial fractures including minimally displaced right lateral orbital wall fracture, fracture of right greater wing of sphenoid bone, and nondisplaced fractures of the right zygomatic arch and right nasal bone.  All findings did not require surgical intervention.  Echocardiogram showed EF 60-65%.  She has undergone ongoing rehab, including speech and cognitive rehab.  She has history of depression and anxiety with panic attacks.  Her depression increased following the accident.    In December 2020, she didn't feel well.  She felt nauseous.  She said that her head "didn't feel right" but it was not a headache.  No lightheadedness or diaphoresis.  She went to bed.  When she woke up, she felt that she had chest pressure.  She got up, she felt dizzy and diaphoretic.  She started to walk to get her husband and the  next thing she knew, she was laying on the floor, maybe for 10 to 15 minutes.  She still felt sick.  No associated slurred speech, visual disturbance or unilateral numbness and weakness.  Afterward, she had vertigo.  When she would start to lay down, she had spinning sensation which would subside after a few seconds.  However, it would start again when she would get up.  When she would get up out of her chair, she felt like she was being pulled to the left.  She saw her doctor a week later.  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  She still has headaches since her initial head trauma.  They are typically moderate (sometimes severe) throbbing pain on either temple or across forehead, usually lasting a day and occurring every week.  She treats with Tylenol  PAST MEDICAL HISTORY: Past Medical History:  Diagnosis Date  . Arthritis    BOTH KNEES  . Chronic kidney disease    HX/O  . Depression   . GERD (gastroesophageal reflux disease)   . IBS (irritable bowel syndrome)   . Wears partial dentures    UPPER AND LOWER    MEDICATIONS: Current Outpatient Medications on File Prior to Visit  Medication Sig Dispense Refill  . calcium-vitamin D (OSCAL WITH D) 500-200 MG-UNIT tablet Take 1 tablet by mouth.    Marland Kitchen  dicyclomine (BENTYL) 10 MG capsule Take 10 mg by mouth at bedtime.     Marland Kitchen HYDROcodone-acetaminophen (NORCO/VICODIN) 5-325 MG tablet Take 1 tablet by mouth every 4 (four) hours as needed for moderate pain. 20 tablet 0  . hydrOXYzine (VISTARIL) 25 MG capsule Take 25 mg by mouth 3 (three) times daily as needed.    . meloxicam (MOBIC) 15 MG tablet TAKE 1 TABLET BY MOUTH EVERY DAY    . Omega-3 Fatty Acids (FISH OIL) 1000 MG CAPS Take 1 capsule by mouth. AM    . venlafaxine XR (EFFEXOR-XR) 150 MG 24 hr capsule Take  150 mg by mouth at bedtime.    Marland Kitchen venlafaxine XR (EFFEXOR-XR) 37.5 MG 24 hr capsule Take with the 150mg  effexor tablet for total dose of 187.5 mg daily     Current Facility-Administered Medications on File Prior to Visit  Medication Dose Route Frequency Provider Last Rate Last Admin  . sodium chloride flush (NS) 0.9 % injection 16 mL  16 mL Intracatheter PRN Axl Rodino R, DO   16 mL at 02/20/19 1407    ALLERGIES: Allergies  Allergen Reactions  . Penicillins Rash  . Sulfa Antibiotics Nausea Only    FAMILY HISTORY: No family history on file.  SOCIAL HISTORY: Social History   Socioeconomic History  . Marital status: Single    Spouse name: Not on file  . Number of children: Not on file  . Years of education: Not on file  . Highest education level: Not on file  Occupational History  . Not on file  Tobacco Use  . Smoking status: Never Smoker  . Smokeless tobacco: Never Used  Substance and Sexual Activity  . Alcohol use: No  . Drug use: No  . Sexual activity: Not on file  Other Topics Concern  . Not on file  Social History Narrative  . Not on file   Social Determinants of Health   Financial Resource Strain:   . Difficulty of Paying Living Expenses:   Food Insecurity:   . Worried About Charity fundraiser in the Last Year:   . Arboriculturist in the Last Year:   Transportation Needs:   . Film/video editor (Medical):   Marland Kitchen Lack of Transportation (Non-Medical):   Physical Activity:   . Days of Exercise per Week:   . Minutes of Exercise per Session:   Stress:   . Feeling of Stress :   Social Connections:   . Frequency of Communication with Friends and Family:   . Frequency of Social Gatherings with Friends and Family:   . Attends Religious Services:   . Active Member of Clubs or Organizations:   . Attends Archivist Meetings:   Marland Kitchen Marital Status:   Intimate Partner Violence:   . Fear of Current or Ex-Partner:   . Emotionally Abused:   Marland Kitchen Physically  Abused:   . Sexually Abused:     PHYSICAL EXAM: Blood pressure (!) 146/84, pulse 82, resp. rate 18, height 5\' 8"  (1.727 m), weight 192 lb (87.1 kg), SpO2 97 %. General: No acute distress.  Patient appears well-groomed.   Head:  Normocephalic/atraumatic Eyes:  Fundi examined but not visualized Neck: supple, no paraspinal tenderness, full range of motion Heart:  Regular rate and rhythm Lungs:  Clear to auscultation bilaterally Back: No paraspinal tenderness Neurological Exam: alert and oriented to person, place, and time. Attention span and concentration intact, recent and remote memory intact, fund of knowledge intact.  Speech fluent and  not dysarthric, language intact.  CN II-XII intact. Bulk and tone normal, muscle strength 5/5 throughout.  Sensation to light touch intact.  Deep tendon reflexes 2+ throughout, toes downgoing.  Finger to nose and heel to shin testing intact.  Gait normal, Romberg negative.  IMPRESSION: 1.  Episode of syncope (possibly vasovagal) with positional vertigo.  Not a cerebrovascular event.  Low suspicion for seizure, but for complete workup, will check EEG.   2.  History of TBI  PLAN: 1.  EEG.  Further recommendations pending results.  Shon Millet, DO  CC: Henreitta Leber, NP

## 2019-05-27 ENCOUNTER — Ambulatory Visit: Payer: Medicare HMO | Admitting: Neurology

## 2019-05-27 ENCOUNTER — Encounter: Payer: Self-pay | Admitting: Neurology

## 2019-05-27 ENCOUNTER — Other Ambulatory Visit: Payer: Self-pay

## 2019-05-27 VITALS — BP 146/84 | HR 82 | Resp 18 | Ht 68.0 in | Wt 192.0 lb

## 2019-05-27 DIAGNOSIS — R55 Syncope and collapse: Secondary | ICD-10-CM

## 2019-05-27 DIAGNOSIS — Z87820 Personal history of traumatic brain injury: Secondary | ICD-10-CM

## 2019-05-27 NOTE — Patient Instructions (Signed)
1.  Suspicion for seizure is low, but we will check routine EEG to make sure. 2.  Otherwise, neurologic workup negative.

## 2019-06-10 ENCOUNTER — Other Ambulatory Visit: Payer: Self-pay

## 2019-06-10 ENCOUNTER — Ambulatory Visit (INDEPENDENT_AMBULATORY_CARE_PROVIDER_SITE_OTHER): Payer: Medicare HMO | Admitting: Neurology

## 2019-06-10 DIAGNOSIS — R55 Syncope and collapse: Secondary | ICD-10-CM | POA: Diagnosis not present

## 2019-06-10 DIAGNOSIS — Z87820 Personal history of traumatic brain injury: Secondary | ICD-10-CM

## 2019-06-10 NOTE — Procedures (Signed)
**Note Chelsea-Identified via Obfuscation** ELECTROENCEPHALOGRAM REPORT  Date of Study: 06/10/2019  Patient's Name: Chelsea Skinner MRN: [id number] Date of Birth: 22-Aug-1950  Clinical History: 69 year old female with episode of syncope  Medications: Venlafaxine Bentyl hydroxyzine  Technical Summary: A multichannel digital EEG recording measured by the international 10-20 system with electrodes applied with paste and impedances below 5000 ohms performed in our laboratory with EKG monitoring in an awake and drowsy patient.  Hyperventilation not performed as patient wearing mask due to COVID-19 pandemic.  Photic stimulation was performed.  The digital EEG was referentially recorded, reformatted, and digitally filtered in a variety of bipolar and referential montages for optimal display.    Description: The patient is awake and drowsy during the recording.  During maximal wakefulness, there is a symmetric, medium voltage 11-12 Hz posterior dominant rhythm that attenuates with eye opening.  The record is symmetric.  During drowsiness, there is an increase in theta slowing of the background.  Stage 2 sleep was not seen.  Photic stimulation did not elicit any abnormalities.  There were no epileptiform discharges or electrographic seizures seen.    EKG lead was unremarkable.  Impression: This awake and drowsy EEG is normal.    Clinical Correlation: A normal EEG does not exclude a clinical diagnosis of epilepsy.  If further clinical questions remain, prolonged EEG may be helpful.  Clinical correlation is advised.   Shon Millet, DO

## 2019-06-11 ENCOUNTER — Telehealth: Payer: Self-pay

## 2019-06-11 NOTE — Telephone Encounter (Signed)
-----   Message from Drema Dallas, DO sent at 06/11/2019  6:49 AM EDT ----- EEG normal.  No further recommendations

## 2019-06-11 NOTE — Telephone Encounter (Signed)
LMOVM 8:00am

## 2019-06-25 ENCOUNTER — Telehealth: Payer: Self-pay | Admitting: Neurology

## 2019-06-25 NOTE — Telephone Encounter (Signed)
Patient called in to see if her EEG results were back. Ok to leave a message if she is not home.

## 2019-06-25 NOTE — Telephone Encounter (Signed)
Called pt and let her know, per Dr Everlena Cooper, that EEG results from 06/10/19 were normal, she verbalized understanding.

## 2021-09-11 IMAGING — MR MR MRA HEAD W/O CM
1 series · 22 of 48 positions shown · non-contrast
Comparison: CT head 12/17/2018

CLINICAL DATA: Syncope.  History of remote head trauma.

EXAM:
MRI HEAD WITHOUT CONTRAST
MRA HEAD WITHOUT CONTRAST
TECHNIQUE: Multiplanar, multiecho pulse sequences of the brain and surrounding
structures were obtained without intravenous contrast. Angiographic
images of the head were obtained using MRA technique without
contrast.

[Series 6: tof_3d_multi-slab new · axial · 0.7mm · 0.35mm/px · z∈[-73,+4]mm · 22 of 117 slices shown]
[im 1/117]
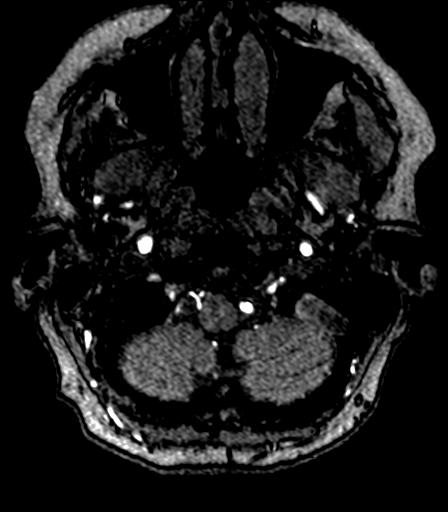
[im 3/117]
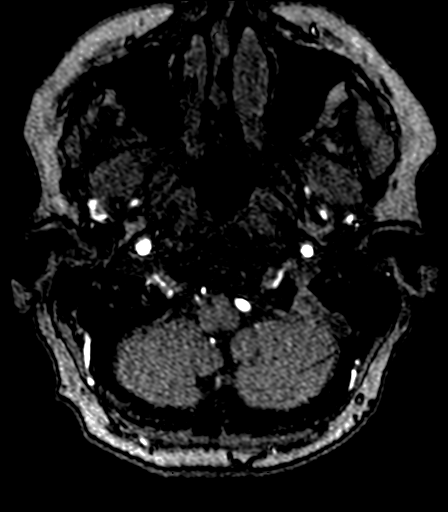
[im 5/117]
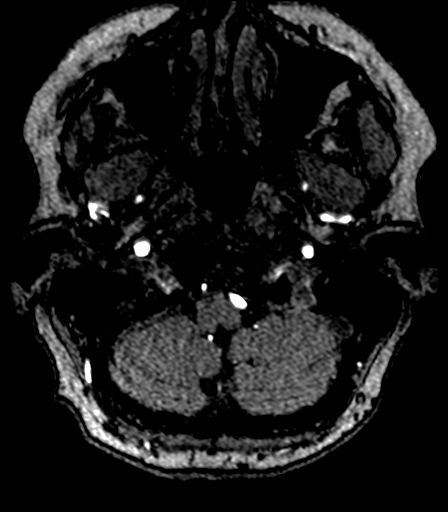
[im 8/117]
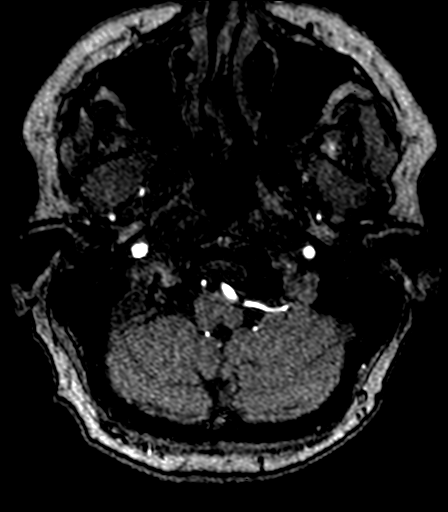
[im 10/117]
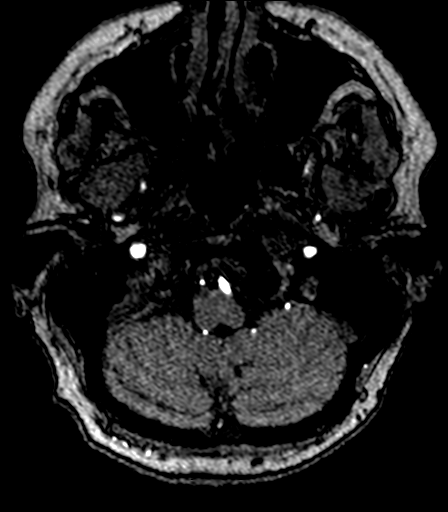
[im 13/117]
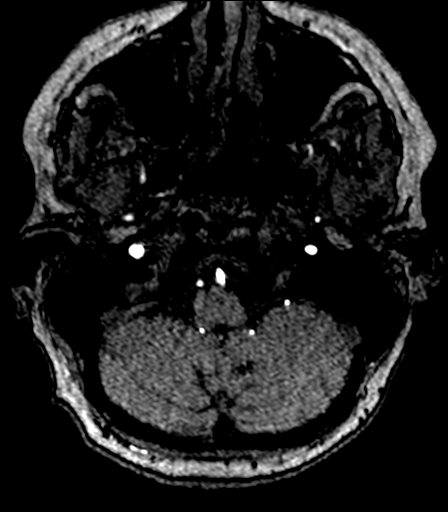
[im 15/117]
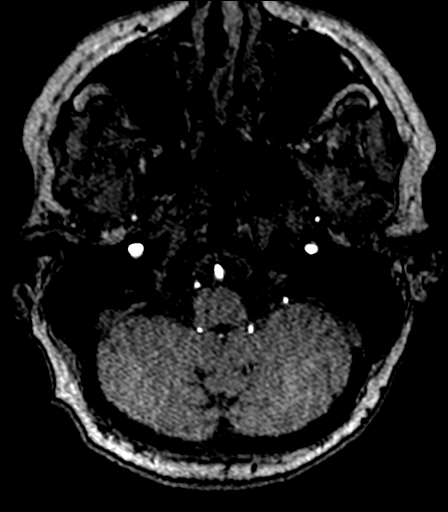
[im 18/117]
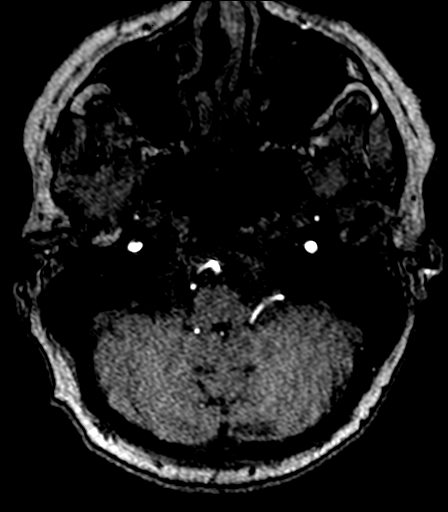
[im 20/117]
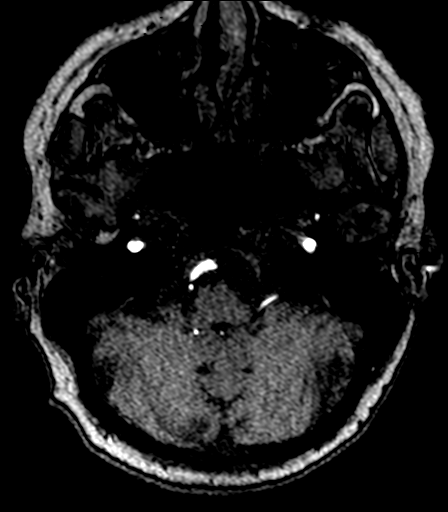
[im 23/117]
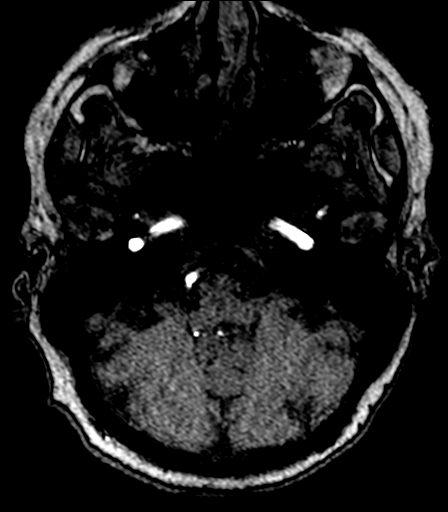
[im 25/117]
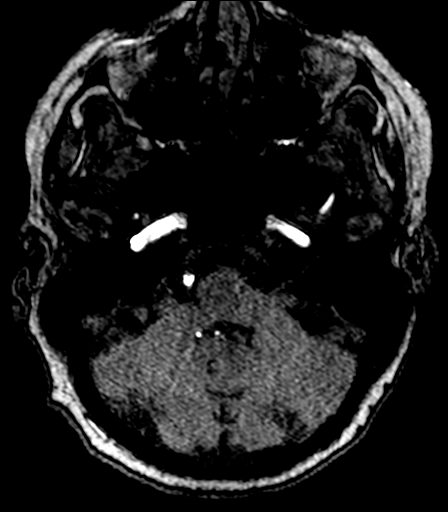
[im 28/117]
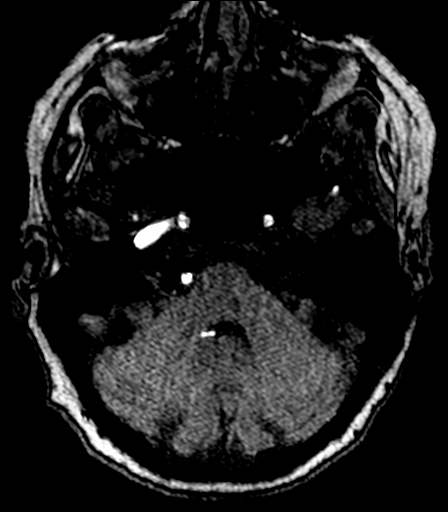
[im 30/117]
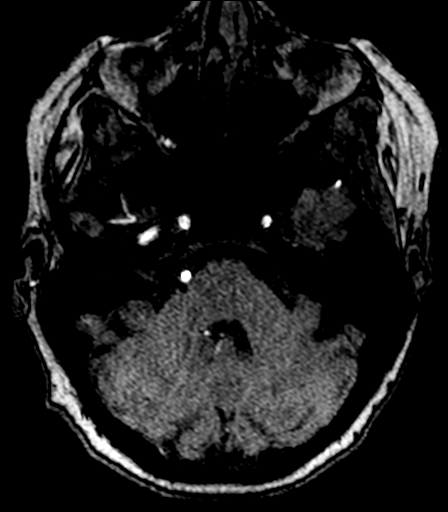
[im 33/117]
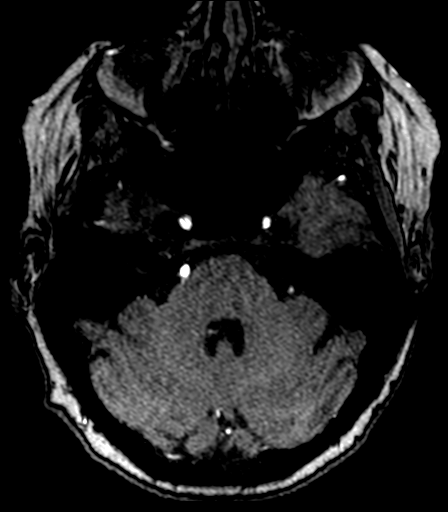
[im 38/117]
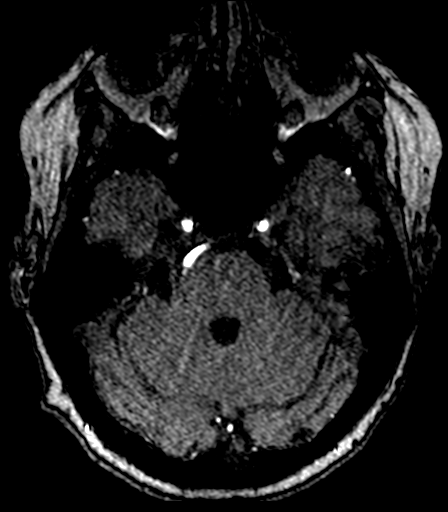
[im 52/117]
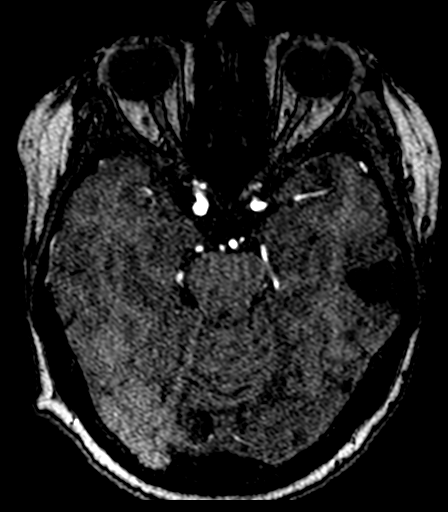
[im 60/117]
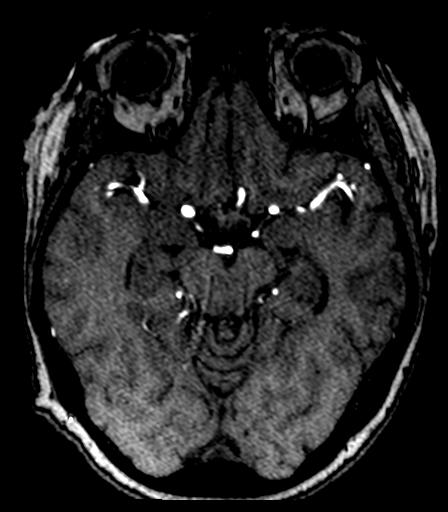
[im 67/117]
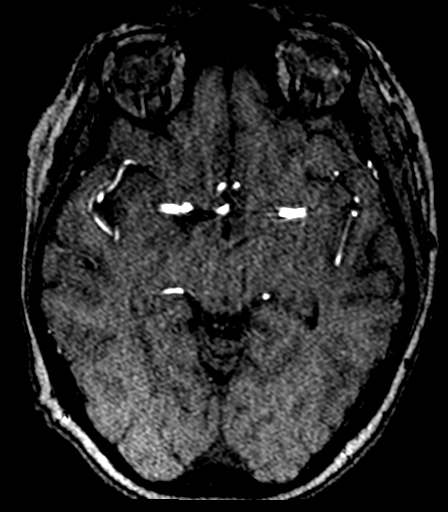
[im 82/117]
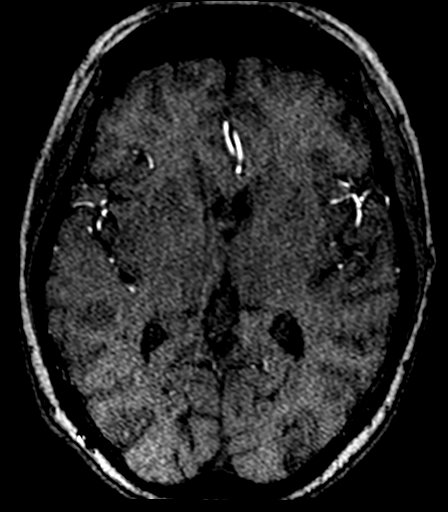
[im 97/117]
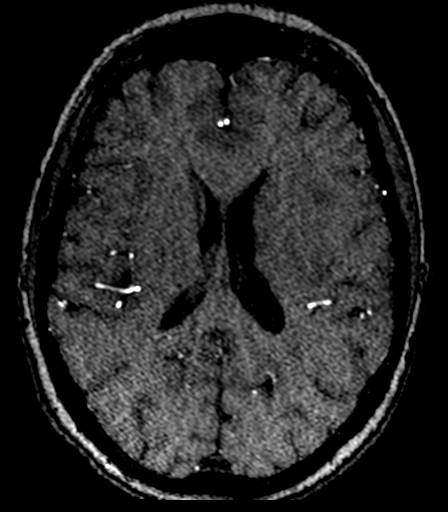
[im 99/117]
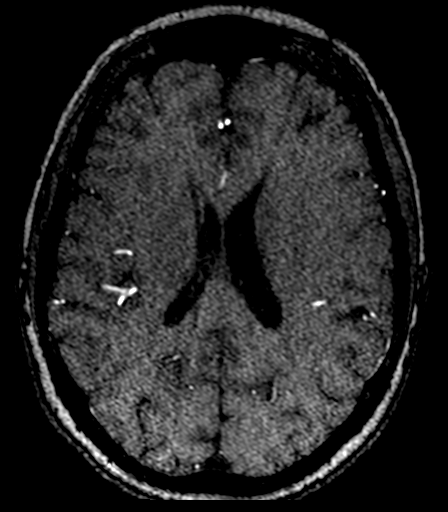
[im 112/117]
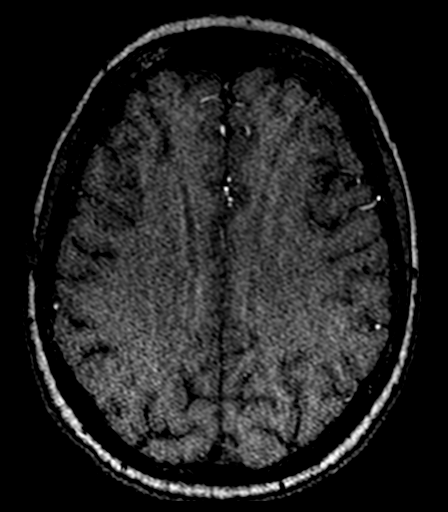

[22 of 48 positions shown; findings below may reference images not displayed]

FINDINGS: MRI HEAD FINDINGS

Brain: Negative for acute infarct. Chronic infarct left posterior
temporal lobe laterally. Based on this location, I would consider
prior trauma or venous infarct.

Recent CT question left anterior frontal subdural hematoma. This
likely was artifactual as no fluid collection or blood is seen in
this area on today's study.

Mild chronic microvascular ischemic change in the white matter and
pons. Ventricle size normal.

Vascular: Normal arterial flow voids

Skull and upper cervical spine: Lytic defect in the occipital bone
in the midline is unchanged most likely due to prominent arachnoid
granulation. No worrisome skull lesion.

Sinuses/Orbits: Paranasal sinuses clear. Cataract extraction on the
right.

Other: None

MRA HEAD FINDINGS

Both vertebral arteries patent to the basilar. Left vertebral
dominant. PICA patent bilaterally. Basilar is tortuous and widely
patent. Superior cerebellar and posterior cerebral arteries patent
bilaterally.

Internal carotid artery patent bilaterally. Left internal carotid
supplies only the left MCA with hypoplastic left A1 segment. Both
anterior cerebral arteries patent and supplied from the right. Right
middle cerebral artery patent without significant stenosis.

Negative for cerebral aneurysm.
IMPRESSION: No acute intracranial abnormality. Previous CT questioned left
frontal subdural hematoma which I believe is an artifact on the CT.
No abnormality seen in this area on MRI today.

Encephalomalacia left posterior temporal lobe laterally. This may
represent a chronic infarct, consider chronic venous infarct or
prior trauma. Mild chronic microvascular ischemia

No acute infarct.

Negative MRA head.

## 2023-04-26 ENCOUNTER — Encounter: Payer: Self-pay | Admitting: Ophthalmology

## 2023-05-02 NOTE — Discharge Instructions (Signed)

## 2023-05-04 ENCOUNTER — Other Ambulatory Visit: Payer: Self-pay

## 2023-05-04 ENCOUNTER — Ambulatory Visit
Admission: RE | Admit: 2023-05-04 | Discharge: 2023-05-04 | Disposition: A | Discharge status: Home / Self Care | Attending: Ophthalmology | Admitting: Ophthalmology

## 2023-05-04 ENCOUNTER — Encounter
Admission: RE | Disposition: A | Discharge status: Home / Self Care | Payer: Self-pay | Source: Home / Self Care | Attending: Ophthalmology

## 2023-05-04 ENCOUNTER — Ambulatory Visit: Payer: Self-pay | Admitting: Anesthesiology

## 2023-05-04 ENCOUNTER — Encounter: Payer: Self-pay | Admitting: Ophthalmology

## 2023-05-04 DIAGNOSIS — H2512 Age-related nuclear cataract, left eye: Secondary | ICD-10-CM | POA: Insufficient documentation

## 2023-05-04 HISTORY — DX: Other chronic pain: G89.29

## 2023-05-04 HISTORY — DX: Dizziness and giddiness: R42

## 2023-05-04 HISTORY — DX: Anxiety disorder, unspecified: F41.9

## 2023-05-04 HISTORY — DX: Unspecified sensorineural hearing loss: H90.5

## 2023-05-04 HISTORY — PX: CATARACT EXTRACTION W/PHACO: SHX586

## 2023-05-04 SURGERY — PHACOEMULSIFICATION, CATARACT, WITH IOL INSERTION
Anesthesia: Monitor Anesthesia Care | Site: Eye | Laterality: Left

## 2023-05-04 MED ORDER — MOXIFLOXACIN HCL 0.5 % OP SOLN
OPHTHALMIC | Status: DC | PRN
  Administered 2023-05-04: .2 mL via OPHTHALMIC

## 2023-05-04 MED ORDER — ARMC OPHTHALMIC DILATING DROPS
OPHTHALMIC | Status: AC
  Filled 2023-05-04: qty 0.5

## 2023-05-04 MED ORDER — LIDOCAINE HCL (PF) 2 % IJ SOLN
INTRAOCULAR | Status: DC | PRN
  Administered 2023-05-04: 4 mL via INTRAOCULAR

## 2023-05-04 MED ORDER — SIGHTPATH DOSE#1 BSS IO SOLN
INTRAOCULAR | Status: DC | PRN
  Administered 2023-05-04: 66 mL via OPHTHALMIC

## 2023-05-04 MED ORDER — MIDAZOLAM HCL 2 MG/2ML IJ SOLN
INTRAMUSCULAR | Status: AC
  Filled 2023-05-04: qty 2

## 2023-05-04 MED ORDER — BRIMONIDINE TARTRATE-TIMOLOL 0.2-0.5 % OP SOLN
OPHTHALMIC | Status: DC | PRN
  Administered 2023-05-04: 1 [drp] via OPHTHALMIC

## 2023-05-04 MED ORDER — FENTANYL CITRATE (PF) 100 MCG/2ML IJ SOLN
INTRAMUSCULAR | Status: AC
Start: 2023-05-04 — End: ?
  Filled 2023-05-04: qty 2

## 2023-05-04 MED ORDER — SIGHTPATH DOSE#1 NA HYALUR & NA CHOND-NA HYALUR IO KIT
PACK | INTRAOCULAR | Status: DC | PRN
  Administered 2023-05-04: 1 via OPHTHALMIC

## 2023-05-04 MED ORDER — TETRACAINE HCL 0.5 % OP SOLN
OPHTHALMIC | Status: AC
  Filled 2023-05-04: qty 4

## 2023-05-04 MED ORDER — FENTANYL CITRATE (PF) 100 MCG/2ML IJ SOLN
INTRAMUSCULAR | Status: DC | PRN
Start: 2023-05-04 — End: 2023-05-04
  Administered 2023-05-04: 50 ug via INTRAVENOUS

## 2023-05-04 MED ORDER — SIGHTPATH DOSE#1 BSS IO SOLN
INTRAOCULAR | Status: DC | PRN
  Administered 2023-05-04: 15 mL via INTRAOCULAR

## 2023-05-04 MED ORDER — TETRACAINE HCL 0.5 % OP SOLN
1.0000 [drp] | OPHTHALMIC | Status: DC | PRN
  Administered 2023-05-04 (×3): 1 [drp] via OPHTHALMIC

## 2023-05-04 MED ORDER — MIDAZOLAM HCL 2 MG/2ML IJ SOLN
INTRAMUSCULAR | Status: DC | PRN
  Administered 2023-05-04: 2 mg via INTRAVENOUS

## 2023-05-04 MED ORDER — ARMC OPHTHALMIC DILATING DROPS
1.0000 | OPHTHALMIC | Status: DC | PRN
  Administered 2023-05-04 (×3): 1 via OPHTHALMIC

## 2023-05-04 SURGICAL SUPPLY — 13 items
CATARACT SUITE SIGHTPATH (MISCELLANEOUS) ×1 IMPLANT
DISSECTOR HYDRO NUCLEUS 50X22 (MISCELLANEOUS) ×1 IMPLANT
DRSG TEGADERM 2-3/8X2-3/4 SM (GAUZE/BANDAGES/DRESSINGS) ×1 IMPLANT
FEE CATARACT SUITE SIGHTPATH (MISCELLANEOUS) ×1 IMPLANT
GLOVE BIOGEL PI IND STRL 8 (GLOVE) ×1 IMPLANT
GLOVE SURG LX STRL 7.5 STRW (GLOVE) ×1 IMPLANT
GLOVE SURG PROTEXIS BL SZ6.5 (GLOVE) ×1 IMPLANT
GLOVE SURG SYN 6.5 PF PI BL (GLOVE) ×1 IMPLANT
LENS CLAREON WAGON WHEEL 21.5 (Intraocular Lens) ×1 IMPLANT
LENS IOL CLRN WGN WHL 21.5 (Intraocular Lens) IMPLANT
NDL FILTER BLUNT 18X1 1/2 (NEEDLE) ×1 IMPLANT
NEEDLE FILTER BLUNT 18X1 1/2 (NEEDLE) ×1 IMPLANT
SYR 3ML LL SCALE MARK (SYRINGE) ×1 IMPLANT

## 2023-05-04 NOTE — Anesthesia Postprocedure Evaluation (Signed)
 Anesthesia Post Note  Patient: Burnie Allee  Procedure(s) Performed: PHACOEMULSIFICATION, CATARACT, WITH IOL  6.36 00:42.7 (Left: Eye)  Patient location during evaluation: PACU Anesthesia Type: MAC Level of consciousness: awake and alert Pain management: pain level controlled Vital Signs Assessment: post-procedure vital signs reviewed and stable Respiratory status: spontaneous breathing, nonlabored ventilation, respiratory function stable and patient connected to nasal cannula oxygen Cardiovascular status: stable and blood pressure returned to baseline Postop Assessment: no apparent nausea or vomiting Anesthetic complications: no   No notable events documented.   Last Vitals:  Vitals:   05/04/23 0822 05/04/23 0827  BP: 127/74 122/84  Pulse: 77 74  Resp: 18 13  Temp: 36.7 C 36.5 C  SpO2: 97% 94%    Last Pain:  Vitals:   05/04/23 0827  TempSrc:   PainSc: 0-No pain                 Yordan Martindale C Jaeleigh Monaco

## 2023-05-04 NOTE — Op Note (Signed)
 OPERATIVE NOTE  Lacreshia Moradi [id number] 05/04/2023   PREOPERATIVE DIAGNOSIS: Nuclear sclerotic cataract left eye. H25.12   POSTOPERATIVE DIAGNOSIS: Nuclear sclerotic cataract left eye. H25.12   PROCEDURE:  Phacoemusification with posterior chamber intraocular lens placement of the left eye  Ultrasound time: Procedure(s): PHACOEMULSIFICATION, CATARACT, WITH IOL  6.36 00:42.7 (Left)  LENS:   Implant Name Type Inv. Item Serial No. Manufacturer Lot No. LRB No. Used Action  LENS CLAREON WAGON WHEEL 21.5 - [id number] Intraocular Lens LENS CLAREON WAGON WHEEL 21.5 [id number] [facility]  Left 1 Implanted      SURGEON:  Rosy Cooper. Donalda Fruit, MD   ANESTHESIA:  Topical with tetracaine  drops, augmented with 1% preservative-free intracameral lidocaine .   COMPLICATIONS:  None.   DESCRIPTION OF PROCEDURE:  The patient was identified in the holding room and transported to the operating room and placed in the supine position under the operating microscope.  The left eye was identified as the operative eye, which was prepped and draped in the usual sterile ophthalmic fashion.   A 1 millimeter clear-corneal paracentesis was made inferotemporally. Preservative-free 1% lidocaine  mixed with 1:1,000 bisulfite-free aqueous solution of epinephrine  was injected into the anterior chamber. The anterior chamber was then filled with Viscoat viscoelastic. A 2.4 millimeter keratome was used to make a clear-corneal incision superotemporally. A curvilinear capsulorrhexis was made with a cystotome and capsulorrhexis forceps. Balanced salt  solution was used to hydrodissect and hydrodelineate the nucleus. Phacoemulsification was then used to remove the lens nucleus and epinucleus. The remaining cortex was then removed using the irrigation and aspiration handpiece. Provisc was then placed into the capsular bag to distend it for lens placement. A +21.50 D [id number] intraocular lens was then injected into the capsular bag. The  remaining viscoelastic was aspirated.   Wounds were hydrated with balanced salt  solution.  The anterior chamber was inflated to a physiologic pressure with balanced salt  solution.  No wound leaks were noted. Moxifloxacin  was injected intracamerally.  Timolol  and Brimonidine  drops were applied to the eye.  The patient was taken to the recovery room in stable condition without complications of anesthesia or surgery.  [employer] 05/04/2023, 8:21 AM

## 2023-05-04 NOTE — H&P (Signed)
 [facility]   Primary Care Physician:  Acey Holding, NP Ophthalmologist: Dr. Meg Spina  Pre-Procedure History & Physical: HPI:  Chelsea Skinner is a 73 y.o. female here for cataract surgery.   Past Medical History:  Diagnosis Date   Anxiety and depression    Arthritis    BOTH KNEES   Chronic kidney disease    HX/O   Chronic pain    Depression    Dizziness    GERD (gastroesophageal reflux disease)    IBS (irritable bowel syndrome)    Sensorineural hearing loss (SNHL) of left ear    Wears partial dentures    UPPER AND LOWER    Past Surgical History:  Procedure Laterality Date   CATARACT EXTRACTION W/PHACO Right 08/24/2015   Procedure: CATARACT EXTRACTION PHACO AND INTRAOCULAR LENS PLACEMENT (IOC);  Surgeon: Billee Buddle, MD;  Location: [facility];  Service: Ophthalmology;  Laterality: Right;  VISION BLUE RIGHT   CYSTOSCOPY      Prior to Admission medications   Medication Sig Start Date End Date Taking? Authorizing Provider  calcium-vitamin D (OSCAL WITH D) 500-200 MG-UNIT tablet Take 1 tablet by mouth.    [provider]  cyclobenzaprine (FLEXERIL) 5 MG tablet Take by mouth 3 (three) times daily as needed. 03/22/19   [provider]  dicyclomine (BENTYL) 10 MG capsule Take 10 mg by mouth as needed.    [provider]  HYDROcodone -acetaminophen  (NORCO/VICODIN) 5-325 MG tablet Take 1 tablet by mouth every 4 (four) hours as needed for moderate pain. Patient not taking: Reported on 05/27/2019 02/05/16   Stafford Eagles, PA-C  hydrOXYzine (VISTARIL) 25 MG capsule Take 25 mg by mouth 3 (three) times daily as needed. 12/03/18   [provider]  meloxicam (MOBIC) 15 MG tablet TAKE 1 TABLET BY MOUTH EVERY DAY 09/06/18   [provider]  Omega-3 Fatty Acids (FISH OIL) 1000 MG CAPS Take 1 capsule by mouth. AM    [provider]  venlafaxine XR (EFFEXOR-XR) 150 MG 24 hr capsule Take 150 mg by mouth at  bedtime.    [provider]  venlafaxine XR (EFFEXOR-XR) 37.5 MG 24 hr capsule Take with the 150mg  effexor tablet for total dose of 187.5 mg daily 05/18/18   [provider]    Allergies as of 04/07/2023 - Review Complete 05/27/2019  Allergen Reaction Noted   Penicillins Rash 08/18/2015   Sulfa antibiotics Nausea Only 08/18/2015    History reviewed. No pertinent family history.  Social History   Socioeconomic History   Marital status: Single    Spouse name: Not on file   Number of children: 2   Years of education: 12   Highest education level: Not on file  Occupational History   Occupation: retired  Tobacco Use   Smoking status: Never   Smokeless tobacco: Never  Substance and Sexual Activity   Alcohol use: No   Drug use: No   Sexual activity: Not on file  Other Topics Concern   Not on file  Social History Narrative   Right handed   One story home   Drinks caffeine   Social Drivers of Health   Financial Resource Strain: Low Risk  (10/26/2022)   Received from [facility]   Overall Financial Resource Strain (CARDIA)    Difficulty of Paying Living Expenses: Not very hard  Food Insecurity: No Food Insecurity (01/30/2023)   Received from [facility]   Hunger Vital Sign    Worried  About Running Out of Food in the Last Year: Never true    Ran Out of Food in the Last Year: Never true  Transportation Needs: No Transportation Needs (01/30/2023)   Received from [facility] - Transportation    Lack of Transportation (Medical): No    Lack of Transportation (Non-Medical): No  Physical Activity: Insufficiently Active (10/26/2022)   Received from [facility]   Exercise Vital Sign    Days of Exercise per Week: 5 days    Minutes of Exercise per Session: 20 min  Stress: Stress Concern Present (10/26/2022)   Received from [facility] - Occupational Stress Questionnaire    Feeling of Stress :  Very much  Social Connections: Socially Integrated (10/26/2022)   Received from [facility]   Social Connection and Isolation Panel [NHANES]    Frequency of Communication with Friends and Family: More than three times a week    Frequency of Social Gatherings with Friends and Family: More than three times a week    Attends Religious Services: More than 4 times per year    Active Member of [employer] or Organizations: Yes    Attends Engineer, structural: More than 4 times per year    Marital Status: Married  Catering manager Violence: Not At Risk (01/30/2023)   Received from [facility]   Humiliation, Afraid, Rape, and Kick questionnaire    Fear of Current or Ex-Partner: No    Emotionally Abused: No    Physically Abused: No    Sexually Abused: No    Review of Systems: See HPI, otherwise negative ROS  Physical Exam: Ht 5\' 8"  (1.727 m)   Wt 77.1 kg   BMI 25.85 kg/m  General:   Alert, cooperative in NAD Head:  Normocephalic and atraumatic. Respiratory:  Normal work of breathing. Cardiovascular:  RRR  Impression/Plan: Chelsea Skinner is here for cataract surgery.  Risks, benefits, limitations, and alternatives regarding cataract surgery have been reviewed with the patient.  Questions have been answered.  All parties agreeable.   Trudi Fus, MD  05/04/2023, 7:11 AM

## 2023-05-04 NOTE — Anesthesia Preprocedure Evaluation (Signed)
 Anesthesia Evaluation  Patient identified by MRN, date of birth, ID band Patient awake    Reviewed: Allergy & Precautions, H&P , NPO status , Patient's Chart, lab work & pertinent test results  Airway Mallampati: III  TM Distance: >3 FB Neck ROM: Full    Dental no notable dental hx. (+) Partial Lower   Pulmonary neg pulmonary ROS   Pulmonary exam normal breath sounds clear to auscultation       Cardiovascular negative cardio ROS Normal cardiovascular exam Rhythm:Regular Rate:Normal     Neuro/Psych negative neurological ROS  negative psych ROS   GI/Hepatic negative GI ROS, Neg liver ROS,,,  Endo/Other  negative endocrine ROS    Renal/GU negative Renal ROS  negative genitourinary   Musculoskeletal negative musculoskeletal ROS (+)    Abdominal   Peds negative pediatric ROS (+)  Hematology negative hematology ROS (+)   Anesthesia Other Findings BS (irritable bowel syndrome) GERD (gastroesophageal reflux disease) Depression  Chronic kidney disease Arthritis  Wears partial dentures Anxiety and depression  Chronic pain Sensorineural hearing loss (SNHL) of left ear     Reproductive/Obstetrics negative OB ROS                              Anesthesia Physical Anesthesia Plan  ASA: 2  Anesthesia Plan: MAC   Post-op Pain Management:    Induction: Intravenous  PONV Risk Score and Plan:   Airway Management Planned: Natural Airway and Nasal Cannula  Additional Equipment:   Intra-op Plan:   Post-operative Plan:   Informed Consent: I have reviewed the patients History and Physical, chart, labs and discussed the procedure including the risks, benefits and alternatives for the proposed anesthesia with the patient or authorized representative who has indicated his/her understanding and acceptance.     Dental Advisory Given  Plan Discussed with: Anesthesiologist, CRNA and  Surgeon  Anesthesia Plan Comments: (Patient consented for risks of anesthesia including but not limited to:  - adverse reactions to medications - damage to eyes, teeth, lips or other oral mucosa - nerve damage due to positioning  - sore throat or hoarseness - Damage to heart, brain, nerves, lungs, other parts of body or loss of life  Patient voiced understanding and assent.)         Anesthesia Quick Evaluation

## 2023-05-04 NOTE — Transfer of Care (Signed)
 Immediate Anesthesia Transfer of Care Note  Patient: Chelsea Skinner  Procedure(s) Performed: PHACOEMULSIFICATION, CATARACT, WITH IOL  6.36 00:42.7 (Left: Eye)  Patient Location: PACU  Anesthesia Type: MAC  Level of Consciousness: awake, alert  and patient cooperative  Airway and Oxygen Therapy: Patient Spontanous Breathing and Patient connected to supplemental oxygen  Post-op Assessment: Post-op Vital signs reviewed, Patient's Cardiovascular Status Stable, Respiratory Function Stable, Patent Airway and No signs of Nausea or vomiting  Post-op Vital Signs: Reviewed and stable  Complications: No notable events documented.
